# Patient Record
Sex: Female | Born: 2000 | Race: Black or African American | Hispanic: No | Marital: Single | Smoking: Never smoker
Health system: Southern US, Community
[De-identification: ages and names within clinical notes are randomized; demographics above are authoritative.]

## PROBLEM LIST (undated history)

## (undated) DIAGNOSIS — F32A Depression, unspecified: Secondary | ICD-10-CM

## (undated) DIAGNOSIS — J45909 Unspecified asthma, uncomplicated: Secondary | ICD-10-CM

## (undated) HISTORY — PX: EAR CYST EXCISION: SHX22

## (undated) HISTORY — DX: Depression, unspecified: F32.A

---
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
## 2022-06-17 ENCOUNTER — Encounter: Payer: Self-pay | Admitting: Nurse Practitioner

## 2022-06-17 ENCOUNTER — Ambulatory Visit: Payer: Medicaid Other | Admitting: Nurse Practitioner

## 2022-06-17 DIAGNOSIS — Z113 Encounter for screening for infections with a predominantly sexual mode of transmission: Secondary | ICD-10-CM

## 2022-06-17 DIAGNOSIS — J45909 Unspecified asthma, uncomplicated: Secondary | ICD-10-CM | POA: Insufficient documentation

## 2022-06-17 LAB — WET PREP FOR TRICH, YEAST, CLUE
Trichomonas Exam: NEGATIVE
Yeast Exam: NEGATIVE

## 2022-06-17 LAB — HM HIV SCREENING LAB: HM HIV Screening: NEGATIVE

## 2022-06-17 LAB — HM HEPATITIS C SCREENING LAB: HM Hepatitis Screen: NEGATIVE

## 2022-06-17 LAB — HEPATITIS B SURFACE ANTIGEN: Hepatitis B Surface Ag: NONREACTIVE

## 2022-06-17 LAB — PREGNANCY, URINE: Preg Test, Ur: NEGATIVE

## 2022-06-17 NOTE — Progress Notes (Signed)
[facility] Department  STI clinic/screening visit [address] [place] [address] [phone]  Subjective:  Laurie Patterson is a 21 y.o. female being seen today for an STI screening visit. The patient reports they do have symptoms.  Patient reports that they do not desire a pregnancy in the next year.   They reported they are not interested in discussing contraception today.  Patient currently has the Nexplanon in place.   Patient's last menstrual period was 05/16/2022 (approximate).   Patient has the following medical conditions:   Patient Active Problem List   Diagnosis Date Noted   Asthma 06/17/2022   Heavy menses 05/31/2020    Chief Complaint  Patient presents with   SEXUALLY TRANSMITTED DISEASE    Screening patient states she feels like her ph is off, she states she is having a musty odor     HPI  Patient reports to clinic today for an STD screening.  Patient report that she has been having discharge that began 3 weeks ago.    Last HIV test per patient/review of record was 09/2021. Patient reports last pap was: NA   Screening for MPX risk: Does the patient have an unexplained rash? No Is the patient MSM? No Does the patient endorse multiple sex partners or anonymous sex partners? No Did the patient have close or sexual contact with a person diagnosed with MPX? No Has the patient traveled outside the [place] where MPX is endemic? No Is there a high clinical suspicion for MPX-- evidenced by one of the following No  -Unlikely to be chickenpox  -Lymphadenopathy  -Rash that present in same phase of evolution on any given body part See flowsheet for further details and programmatic requirements.   Immunization history:   There is no immunization history on file for this patient.   The following portions of the patient's history were reviewed and updated as appropriate: allergies, current medications, past medical history, past social history,  past surgical history and problem list.  Objective:  There were no vitals filed for this visit.  Physical Exam Constitutional:      Appearance: Normal appearance.  HENT:     Head: Normocephalic. No abrasion, masses or laceration. Hair is normal.     Right Ear: External ear normal.     Left Ear: External ear normal.     Nose: Nose normal.     Mouth/Throat:     Mouth: No oral lesions.     Dentition: No dental caries.     Pharynx: No oropharyngeal exudate or posterior oropharyngeal erythema.     Tonsils: No tonsillar exudate or tonsillar abscesses.  Eyes:     General: Lids are normal.        Right eye: No discharge.        Left eye: No discharge.     Conjunctiva/sclera: Conjunctivae normal.     Right eye: No exudate.    Left eye: No exudate. Abdominal:     General: Abdomen is flat.     Palpations: Abdomen is soft.     Tenderness: There is no abdominal tenderness. There is no rebound.  Genitourinary:    Pubic Area: No rash or pubic lice.      Labia:        Right: No rash, tenderness, lesion or injury.        Left: No rash, tenderness, lesion or injury.      Vagina: Normal. No vaginal discharge, erythema or lesions.  Cervix: No cervical motion tenderness, discharge, lesion or erythema.     Uterus: Not enlarged and not tender.      Rectum: Normal.     Comments: Amount Discharge: small  Odor: Yes pH: greater than 4.5 Adheres to vaginal wall: No Color: spotting  Musculoskeletal:     Cervical back: Full passive range of motion without pain, normal range of motion and neck supple.  Lymphadenopathy:     Cervical: No cervical adenopathy.     Right cervical: No superficial, deep or posterior cervical adenopathy.    Left cervical: No superficial, deep or posterior cervical adenopathy.     Upper Body:     Right upper body: No supraclavicular, axillary or epitrochlear adenopathy.     Left upper body: No supraclavicular, axillary or epitrochlear adenopathy.     Lower Body: No  right inguinal adenopathy. No left inguinal adenopathy.  Skin:    General: Skin is warm and dry.     Findings: No lesion or rash.  Neurological:     Mental Status: She is alert and oriented to person, place, and time.  Psychiatric:        Attention and Perception: Attention normal.        Mood and Affect: Mood normal.        Speech: Speech normal.        Behavior: Behavior normal. Behavior is cooperative.      Assessment and Plan:  Laurie Patterson is a 21 y.o. female presenting to the [facility] Department for STI screening  1. Screening examination for venereal disease -21 year old female in clinic today for STD screening.  Patient desires a PT today, PT negative.   -Patient accepted all screenings including oral, vaginal CT/GC, wet prep and bloodwork for HIV/RPR.  Patient meets criteria for HepB screening? Yes. Ordered? Yes Patient meets criteria for HepC screening? Yes. Ordered? Yes  Treat wet prep per standing order Discussed time line for [facility] Lab results and that patient will be called with positive results and encouraged patient to call if she had not heard in 2 weeks.  Counseled to return or seek care for continued or worsening symptoms Recommended condom use with all sex  Patient is currently using *Nexplanon to prevent pregnancy.    - Pregnancy, urine - HIV/HCV [facility] - Syphilis Serology, [facility] - HBV Antigen/Antibody [facility] - Chlamydia/Gonorrhea [facility] - Chlamydia/Gonorrhea [facility] - WET PREP FOR TRICH, YEAST, CLUE   Total time spent: 30 minutes   Return if symptoms worsen or fail to improve.    Glenna Fellows, FNP

## 2022-06-17 NOTE — Progress Notes (Signed)
Pt here for STI screening and pregnancy test.  Wet mount results reviewed.  No treatment needed.  Pregnancy test results provided.  Condoms Eloisa Northern, RN

## 2022-08-27 ENCOUNTER — Emergency Department
Admission: EM | Admit: 2022-08-27 | Discharge: 2022-08-27 | Disposition: A | Discharge status: Home / Self Care | Payer: Medicaid Other | Attending: Emergency Medicine | Admitting: Emergency Medicine

## 2022-08-27 ENCOUNTER — Other Ambulatory Visit: Payer: Self-pay

## 2022-08-27 ENCOUNTER — Encounter: Payer: Self-pay | Admitting: Emergency Medicine

## 2022-08-27 DIAGNOSIS — W458XXA Other foreign body or object entering through skin, initial encounter: Secondary | ICD-10-CM | POA: Diagnosis not present

## 2022-08-27 DIAGNOSIS — J45909 Unspecified asthma, uncomplicated: Secondary | ICD-10-CM | POA: Diagnosis not present

## 2022-08-27 DIAGNOSIS — M795 Residual foreign body in soft tissue: Secondary | ICD-10-CM

## 2022-08-27 DIAGNOSIS — S3992XA Unspecified injury of lower back, initial encounter: Secondary | ICD-10-CM | POA: Diagnosis present

## 2022-08-27 DIAGNOSIS — S30850A Superficial foreign body of lower back and pelvis, initial encounter: Secondary | ICD-10-CM | POA: Insufficient documentation

## 2022-08-27 MED ORDER — LIDOCAINE-EPINEPHRINE-TETRACAINE (LET) TOPICAL GEL
3.0000 mL | Freq: Once | TOPICAL | Status: AC
  Administered 2022-08-27: 3 mL via TOPICAL
  Filled 2022-08-27: qty 3

## 2022-08-27 MED ORDER — BACITRACIN ZINC 500 UNIT/GM EX OINT
TOPICAL_OINTMENT | CUTANEOUS | Status: AC
  Filled 2022-08-27: qty 0.9

## 2022-08-27 MED ORDER — LIDOCAINE-EPINEPHRINE 2 %-1:100000 IJ SOLN
20.0000 mL | Freq: Once | INTRAMUSCULAR | Status: AC
Start: 2022-08-27 — End: 2022-08-27
  Administered 2022-08-27: 20 mL
  Filled 2022-08-27: qty 1

## 2022-08-27 NOTE — Discharge Instructions (Signed)
Keep wounds clean and dry.  Return to the ER for worsening symptoms, increased redness/swelling, purulent discharge or other concerns. °

## 2022-08-27 NOTE — ED Provider Notes (Signed)
[facility] Provider Note    Event Date/Time   First MD Initiated Contact with Patient 08/27/22 0234     (approximate)   History   Foreign Body   HPI  Laurie Patterson is a 21 y.o. female who presents to the ED from home with a chief complaint of requesting removal of her lower back dermal piercings.  Patient is here with her mother who is also being seen.  Had transdermal piercings placed in her lower back in May.  The right-sided one broke off months ago and only the posterior remains.  States the piercings have become a nuisance and painful so she would like them removed.     Past Medical History   Past Medical History:  Diagnosis Date   Asthma      Active Problem List   Patient Active Problem List   Diagnosis Date Noted   Asthma 06/17/2022   Heavy menses 05/31/2020     Past Surgical History  History reviewed. No pertinent surgical history.   Home Medications   Prior to Admission medications   Medication Sig Start Date End Date Taking? Authorizing Provider  etonogestrel (NEXPLANON) 68 MG IMPL implant Inject into the skin. 11/13/18   Tessa Lerner, NP  Multiple Vitamins-Minerals (MULTIVITAMIN WITH MINERALS) tablet Take 1 tablet by mouth daily. Patient not taking: Reported on 09/26/2021 05/31/20   Alberteen Spindle, CNM  naproxen (NAPROSYN) 375 MG tablet Take 375 mg by mouth 2 (two) times daily with a meal.    [provider]  norgestimate-ethinyl estradiol (SPRINTEC 28) 0.25-35 MG-MCG tablet Take 1 tablet by mouth daily. Patient not taking: Reported on 09/26/2021 05/31/20   Alberteen Spindle, CNM  ondansetron (ZOFRAN) 8 MG tablet Take 1 tablet (8 mg total) by mouth every 8 (eight) hours as needed for nausea or vomiting. Patient not taking: Reported on 09/26/2021 12/09/18   Joni Reining, PA-C  tranexamic acid (LYSTEDA) 650 MG TABS tablet Take 1,300 mg by mouth 3 (three) times daily. Patient not taking: Reported on  09/26/2021 05/25/20   [provider]     Allergies  Patient has no known allergies.   Family History   Family History  Problem Relation Age of Onset   Hypertension Mother    Cancer Maternal Grandmother    Hypertension Maternal Grandmother    Eclampsia Maternal Grandmother    Eclampsia Maternal Grandfather    Seizures Maternal Grandfather      Physical Exam  Triage Vital Signs: ED Triage Vitals  Enc Vitals Group     BP 08/27/22 0233 117/72     Pulse Rate 08/27/22 0233 98     Resp 08/27/22 0233 19     Temp 08/27/22 0233 98.6 F (37 C)     Temp Source 08/27/22 0233 Oral     SpO2 08/27/22 0233 100 %     Weight 08/27/22 0225 150 lb (68 kg)     Height 08/27/22 0225 5\' 9"  (1.753 m)     Head Circumference --      Peak Flow --      Pain Score 08/27/22 0225 0     Pain Loc --      Pain Edu? --      Excl. in GC? --     Updated Vital Signs: BP 117/72 (BP Location: Right Arm)   Pulse 98   Temp 98.6 F (37 C) (Oral)   Resp 19   Ht 5\' 9"  (1.753 m)  Wt 68 kg   LMP 08/27/2022 (Exact Date)   SpO2 100%   BMI 22.15 kg/m    General: Awake, no distress.  CV:  Good peripheral perfusion.  Resp:  Normal effort.  Abd:  No distention.  Other:  Lower back demonstrates one intact transdermal piercing on the left; right one has broken off and only the posterior remains.   ED Results / Procedures / Treatments  Labs (all labs ordered are listed, but only abnormal results are displayed) Labs Reviewed - No data to display   EKG  None   RADIOLOGY None   Official radiology report(s): No results found.   PROCEDURES:  Critical Care performed: No  ..Laceration Repair  Date/Time: 08/27/2022 4:01 AM  Performed by: Irean Hong, MD Authorized by: Irean Hong, MD   Consent:    Consent obtained:  Verbal   Consent given by:  Patient   Risks discussed:  Infection, pain, poor cosmetic result and poor wound healing Anesthesia:    Anesthesia method:  Local  infiltration   Local anesthetic:  Lidocaine 1% w/o epi Laceration details:    Location:  Trunk   Trunk location:  Lower back Pre-procedure details:    Preparation:  Patient was prepped and draped in usual sterile fashion Exploration:    Hemostasis achieved with:  Direct pressure and LET   Contaminated: no   Treatment:    Area cleansed with:  Povidone-iodine   Amount of cleaning:  Extensive   Visualized foreign bodies/material removed: no   Repair type:    Repair type:  Simple Post-procedure details:    Dressing:  Sterile dressing and antibiotic ointment   Procedure completion:  Tolerated well, no immediate complications Comments:     Both transdermal sites cleansed, numbed w/ 1% lido w/ epi and sterile prep. Stab incisions made, blunt dissection and removal of both intact transdermal as well as broken one w/ post remaining. Remaining skin tissue w/o erythema, warmth or fluctuance. Bacitracin and dressing applied by nurse.     MEDICATIONS ORDERED IN ED: Medications  lidocaine-EPINEPHrine-tetracaine (LET) topical gel (3 mLs Topical Given 08/27/22 0305)  lidocaine-EPINEPHrine (XYLOCAINE W/EPI) 2 %-1:100000 (with pres) injection 20 mL (20 mLs Infiltration Given by Other 08/27/22 0305)  bacitracin 500 UNIT/GM ointment (  Given 08/27/22 0411)     IMPRESSION / MDM / ASSESSMENT AND PLAN / ED COURSE  I reviewed the triage vital signs and the nursing notes.                             21 year old female requesting to have her transdermal piercings removed.  I had a frank discussion with the patient that I have never personally removed these types of piercings before and queried why she does not go back to the place which originally placed her piercings.  Patient states she did call but they no longer do piercing so did not remove them and has not called other places.  I did offer to numb her up, make an incision with a scalpel and bluntly dissect to remove the piercings.  Patient  acknowledges that I have never done this procedure before but would like me to try.  Patient's presentation is most consistent with acute, uncomplicated illness.  0354 Patient tolerated removal well. Both transdermal piercings removed and given to patient. Area cleansed, bacitracin applied with gauze dressing. Strict return precautions given. Patient verbalizes understanding agrees with plan of care.  FINAL CLINICAL IMPRESSION(S) /  ED DIAGNOSES   Final diagnoses:  Foreign body (FB) in soft tissue     Rx / DC Orders   ED Discharge Orders     None        Note:  This document was prepared using Dragon voice recognition software and may include unintentional dictation errors.   Paulette Blanch, MD 08/27/22 [phone]

## 2022-08-27 NOTE — ED Triage Notes (Addendum)
Patient ambulatory to triage with steady gait, without difficulty or distress noted; pt reports she would like her lower back dermal piercings removed; placed in May; no redness or swelling noted to site

## 2022-11-13 ENCOUNTER — Encounter: Payer: Self-pay | Admitting: Family Medicine

## 2022-11-13 ENCOUNTER — Ambulatory Visit (LOCAL_COMMUNITY_HEALTH_CENTER): Payer: Medicaid Other | Admitting: Family Medicine

## 2022-11-13 ENCOUNTER — Ambulatory Visit: Payer: Medicaid Other | Admitting: Family Medicine

## 2022-11-13 VITALS — BP 114/77 | Ht 69.0 in | Wt 163.0 lb

## 2022-11-13 DIAGNOSIS — Z113 Encounter for screening for infections with a predominantly sexual mode of transmission: Secondary | ICD-10-CM

## 2022-11-13 DIAGNOSIS — Z62810 Personal history of physical and sexual abuse in childhood: Secondary | ICD-10-CM | POA: Insufficient documentation

## 2022-11-13 DIAGNOSIS — Z309 Encounter for contraceptive management, unspecified: Secondary | ICD-10-CM

## 2022-11-13 DIAGNOSIS — Z3046 Encounter for surveillance of implantable subdermal contraceptive: Secondary | ICD-10-CM | POA: Diagnosis not present

## 2022-11-13 DIAGNOSIS — Z0000 Encounter for general adult medical examination without abnormal findings: Secondary | ICD-10-CM

## 2022-11-13 LAB — WET PREP FOR TRICH, YEAST, CLUE
Trichomonas Exam: NEGATIVE
Yeast Exam: NEGATIVE

## 2022-11-13 LAB — HM HIV SCREENING LAB: HM HIV Screening: NEGATIVE

## 2022-11-13 MED ORDER — ETONOGESTREL 68 MG ~~LOC~~ IMPL
68.0000 mg | DRUG_IMPLANT | Freq: Once | SUBCUTANEOUS | Status: AC
  Administered 2022-11-13: 68 mg via SUBCUTANEOUS

## 2022-11-13 NOTE — Progress Notes (Signed)
Pt is here for PE, Nexplanon removal and reinsertion.  She also wants STD screening. Provider completed procedures without any difficulties.  Pt tolerated well.  FP packet given.  Windle Guard, RN

## 2022-11-13 NOTE — Progress Notes (Signed)
[facility] Department  STI clinic/screening visit [place] [address] [phone]  Subjective:  Laurie Patterson is a 22 y.o. female being seen today for an STI screening visit. The patient reports they do not have symptoms.  Patient reports that they do not desire a pregnancy in the next year.   They reported they are not interested in discussing contraception today.    Patient's last menstrual period was 11/06/2022.   Patient has the following medical conditions:   Patient Active Problem List   Diagnosis Date Noted   Asthma 06/17/2022   Heavy menses 05/31/2020    Chief Complaint  Patient presents with   SEXUALLY TRANSMITTED DISEASE    Screening- patient is not having any symptoms     HPI  Patient reports to clinic for STI Testing.  Does the patient using douching products? No  Last HIV test per patient/review of record was  Lab Results  Component Value Date   HMHIVSCREEN Negative - Validated 06/17/2022   No results found for: "HIV" Patient reports last pap was never patient is not 23.   Screening for MPX risk: Does the patient have an unexplained rash? No Is the patient MSM? No Does the patient endorse multiple sex partners or anonymous sex partners? No Did the patient have close or sexual contact with a person diagnosed with MPX? No Has the patient traveled outside the [place] where MPX is endemic? No Is there a high clinical suspicion for MPX-- evidenced by one of the following No  -Unlikely to be chickenpox  -Lymphadenopathy  -Rash that present in same phase of evolution on any given body part See flowsheet for further details and programmatic requirements.   Immunization history:   There is no immunization history on file for this patient.   The following portions of the patient's history were reviewed and updated as appropriate: allergies, current medications, past medical history, past social history, past surgical  history and problem list.  Objective:  There were no vitals filed for this visit.  Physical Exam Vitals and nursing note reviewed.  Constitutional:      Appearance: Normal appearance.  HENT:     Head: Normocephalic and atraumatic.     Mouth/Throat:     Mouth: Mucous membranes are moist.     Pharynx: Oropharynx is clear. No oropharyngeal exudate or posterior oropharyngeal erythema.  Pulmonary:     Effort: Pulmonary effort is normal.  Abdominal:     General: Abdomen is flat.     Palpations: There is no mass.     Tenderness: There is no abdominal tenderness. There is no rebound.  Genitourinary:    Comments: Declined genital exam- self swabbed Lymphadenopathy:     Head:     Right side of head: No preauricular or posterior auricular adenopathy.     Left side of head: No preauricular or posterior auricular adenopathy.     Cervical: No cervical adenopathy.     Upper Body:     Right upper body: No supraclavicular, axillary or epitrochlear adenopathy.     Left upper body: No supraclavicular, axillary or epitrochlear adenopathy.  Skin:    General: Skin is warm and dry.     Findings: No rash.  Neurological:     Mental Status: She is alert and oriented to person, place, and time.     Assessment and Plan:  Laurie Patterson is a 22 y.o. female presenting to the [facility] Department for STI screening  1. Screening for venereal disease  - HIV [facility] [facility] - Syphilis Serology, [facility] - [facility]   Patient accepted all screenings including vaginal CT/GC and bloodwork for HIV/RPR, and wet prep. Patient meets criteria for HepB screening? No. Ordered? No - not indicated Patient meets criteria for HepC screening? No. Ordered? No - not indicated  Treat wet prep per standing order Discussed time line for State Lab results and that patient will be called with positive results and encouraged patient to call if she had not heard in 2 weeks.   Counseled to return or seek care for continued or worsening symptoms Recommended condom use with all sex  Patient is currently using *Nexplanon to prevent pregnancy.    No follow-ups on file.  Future Appointments  Date Time Provider [place]  11/13/2022  8:30 AM Sharlet Salina, FNP [facility] None  11/14/2022 11:00 AM Jonetta Osgood, NP [place] None    Sharlet Salina, [place]
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
## 2022-11-14 ENCOUNTER — Ambulatory Visit: Payer: Self-pay | Admitting: Nurse Practitioner

## 2022-12-03 ENCOUNTER — Other Ambulatory Visit: Payer: Self-pay

## 2022-12-03 ENCOUNTER — Emergency Department
Admission: EM | Admit: 2022-12-03 | Discharge: 2022-12-03 | Disposition: A | Discharge status: Home / Self Care | Payer: Medicaid Other | Attending: Emergency Medicine | Admitting: Emergency Medicine

## 2022-12-03 DIAGNOSIS — R519 Headache, unspecified: Secondary | ICD-10-CM | POA: Diagnosis not present

## 2022-12-03 MED ORDER — DROPERIDOL 2.5 MG/ML IJ SOLN
1.2500 mg | Freq: Once | INTRAMUSCULAR | Status: AC
  Administered 2022-12-03: 1.25 mg via INTRAMUSCULAR
  Filled 2022-12-03: qty 2

## 2022-12-03 MED ORDER — BUTALBITAL-APAP-CAFFEINE 50-325-40 MG PO TABS
2.0000 | ORAL_TABLET | Freq: Once | ORAL | Status: AC
  Administered 2022-12-03: 2 via ORAL
  Filled 2022-12-03: qty 2

## 2022-12-03 MED ORDER — KETOROLAC TROMETHAMINE 30 MG/ML IJ SOLN
30.0000 mg | Freq: Once | INTRAMUSCULAR | Status: AC
Start: 2022-12-03 — End: 2022-12-03
  Administered 2022-12-03: 30 mg via INTRAMUSCULAR
  Filled 2022-12-03: qty 1

## 2022-12-03 NOTE — Discharge Instructions (Signed)
Use Tylenol for pain and fevers.  Up to 1000 mg per dose, up to 4 times per day.  Do not take more than 4000 mg of Tylenol/acetaminophen within 24 hours..  Use naproxen/Aleve for anti-inflammatory pain relief. Use up to 500mg every 12 hours. Do not take more frequently than this. Do not use other NSAIDs (ibuprofen, Advil) while taking this medication. It is safe to take Tylenol with this.   
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
## 2022-12-11 ENCOUNTER — Telehealth: Payer: BLUE CROSS/BLUE SHIELD | Admitting: Physician Assistant

## 2022-12-11 DIAGNOSIS — L910 Hypertrophic scar: Secondary | ICD-10-CM

## 2022-12-11 NOTE — Progress Notes (Signed)
Because this is a chronic issue that needs further evaluation and management than we can provide via e-visit, I feel your condition warrants further evaluation and I recommend that you be seen in a face to face visit.   NOTE: There will be NO CHARGE for this eVisit   If you are having a true medical emergency please call 911.      For an urgent face to face visit, [place] has eight urgent care centers for your convenience:   NEW!! [place] [facility] [place] [facility] [place] Get Driving Directions [phone] [address] [place], [place] [facility] [place] at [place] Get Driving Directions [phone] [address] [place], [place] [address]   [place] [facility] [place]) Get Driving Directions [phone] [address], [place] [address]  [place] [facility] [place] ([place]) Get Driving Directions [phone] [address] [place],  [place]  [address]  [place] [facility] - at [address] Get Driving Directions  [phone] [address] [place],  [place] [address]   [place] [facility] Get Driving Directions [phone] [address] [place], [address] [place], [place] [address]   [place] [facility] Get Driving Directions  [phone] [address] [place], [place] [address]   [place] [facility] [place] Get Driving Directions [phone] [address], [place], [place] [address]  Your MyChart E-visit questionnaire answers were reviewed by a board certified advanced clinical practitioner to complete your personal care plan based on your specific symptoms.  Thank you for using e-Visits.

## 2023-01-03 ENCOUNTER — Ambulatory Visit: Payer: Medicaid Other

## 2023-01-15 IMAGING — CT CT HEAD W/O CM
4 series · 17 of 47 positions shown, 19 images · non-contrast
Comparison: None.

CLINICAL DATA: Chronic headaches



[Series 2: head wo · axial · 0.43mm/px · z∈[-84,+26]mm · 7 of 30 slices shown, 9 images]
[im 4/30  brain]
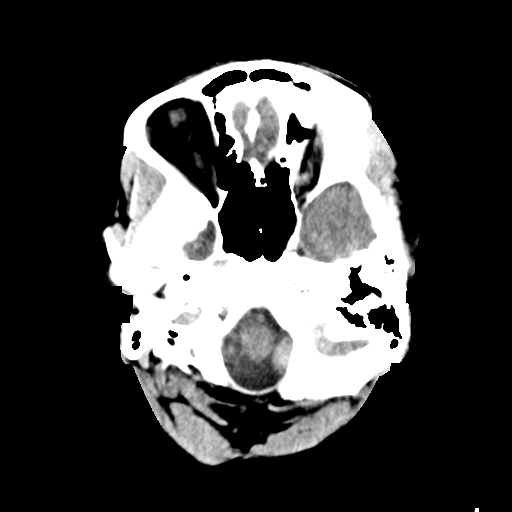
[im 4/30  bone]
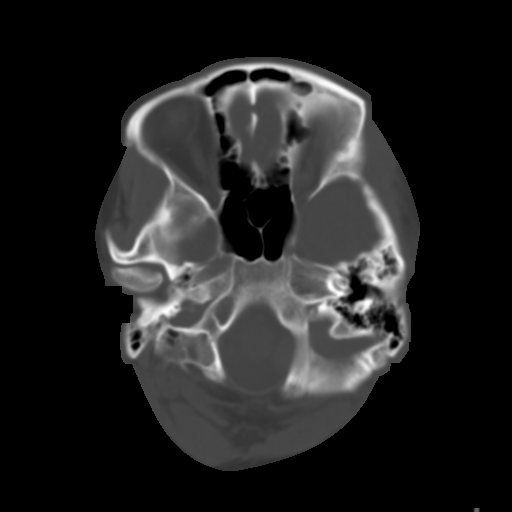
[im 8/30  brain]
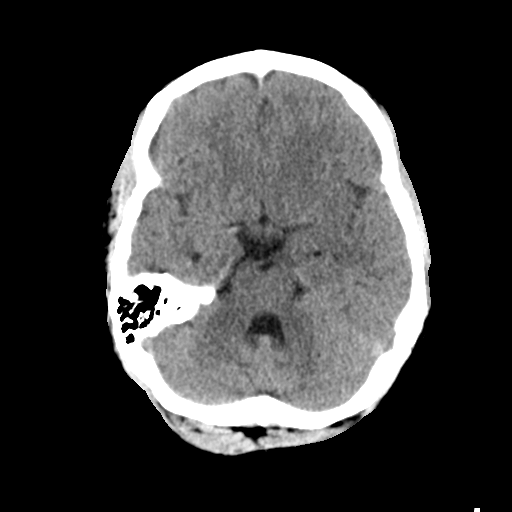
[im 11/30  brain]
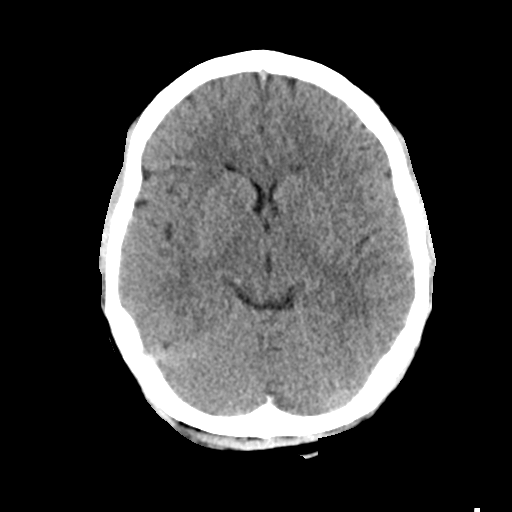
[im 15/30  brain]
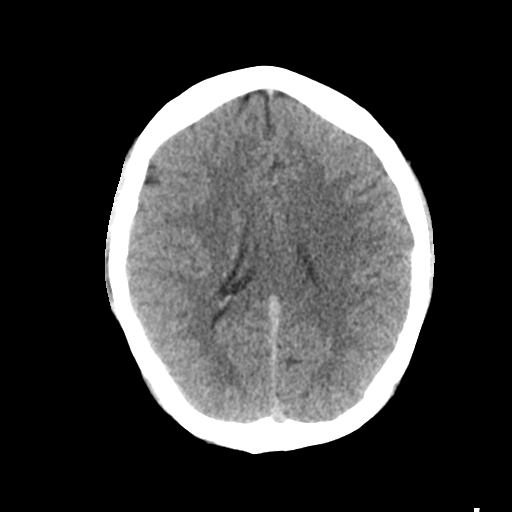
[im 19/30  brain]
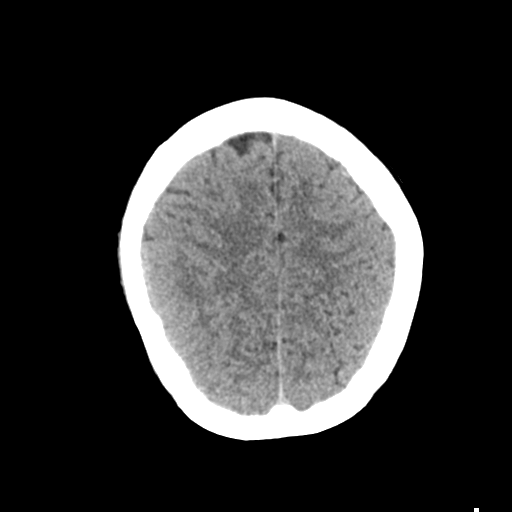
[im 19/30  bone]
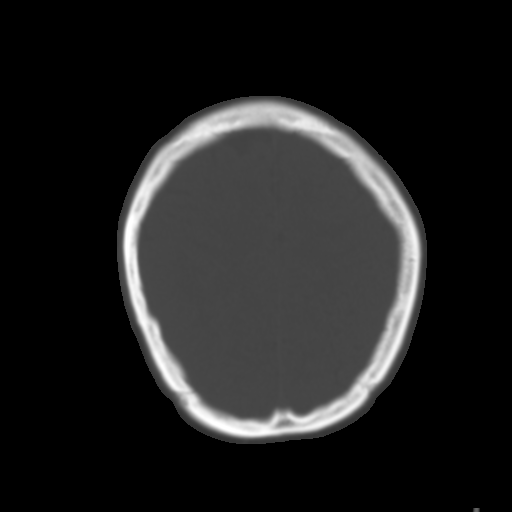
[im 22/30  brain]
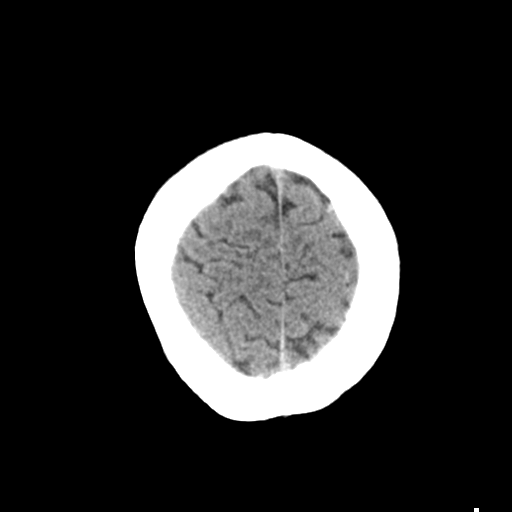
[im 26/30  brain]
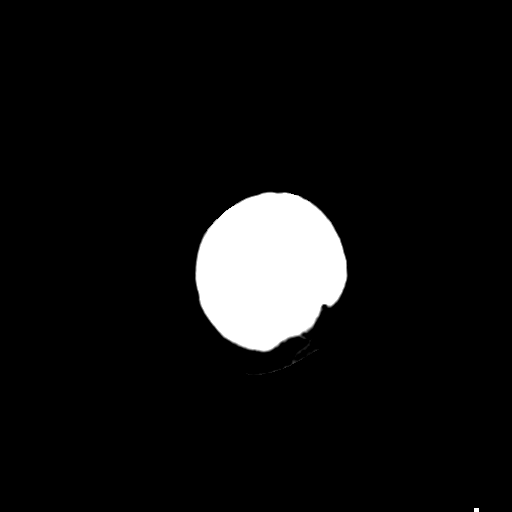

[Series 3: head bone · axial · 0.43mm/px · z∈[-85,-35]mm · 4 of 74 slices shown]
[im 8/74  bone]
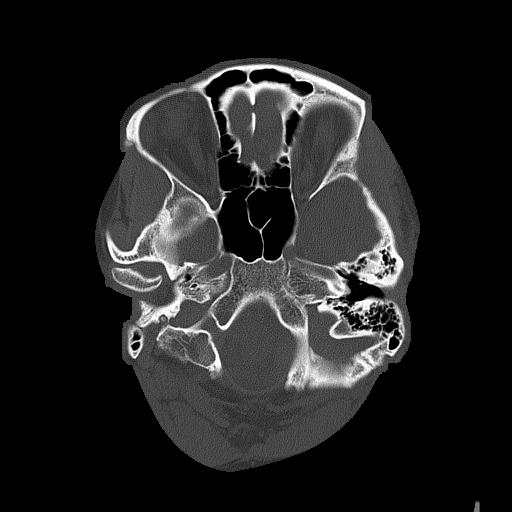
[im 15/74  bone]
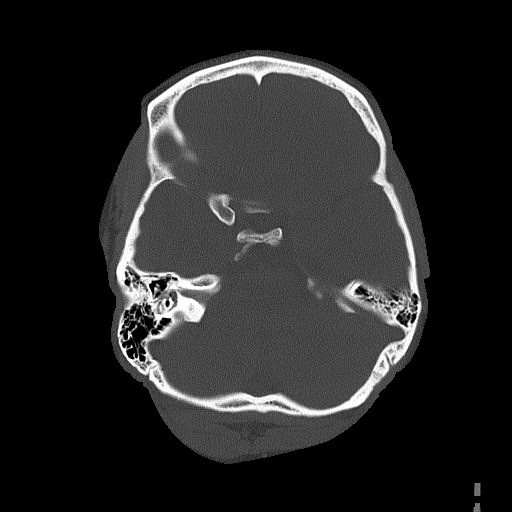
[im 22/74  bone]
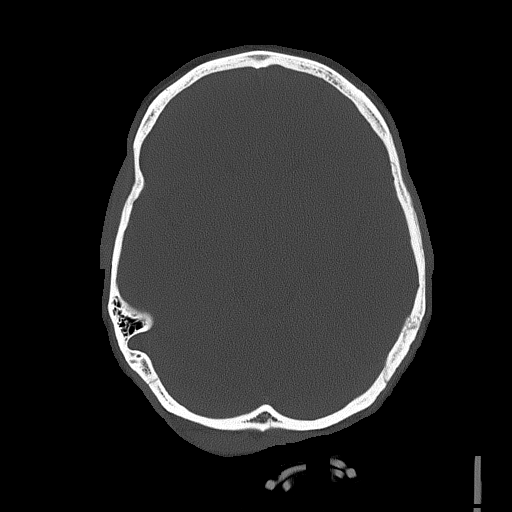
[im 33/74  bone]
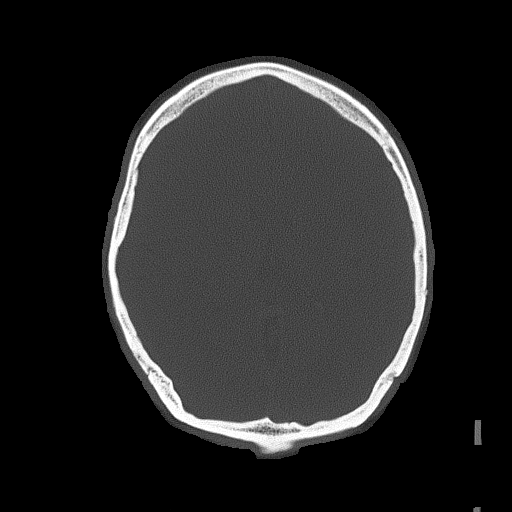

[Series 4: coronal soft tissue · coronal · 0.32mm/px · 3 of 62 slices shown]
[im 21/62  brain]
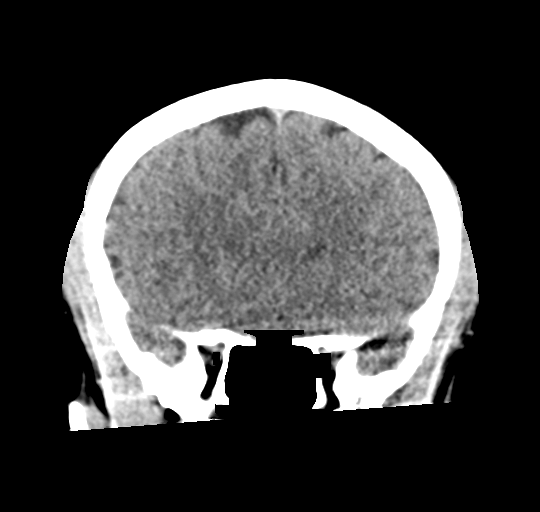
[im 28/62  brain]
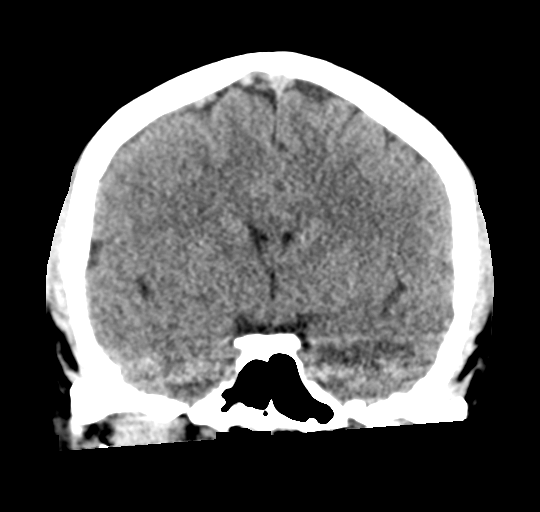
[im 34/62  brain]
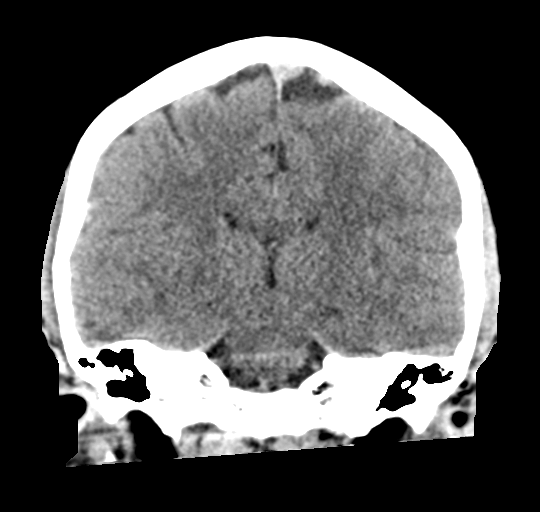

[Series 5: sagittal soft tissue · sagittal · 0.32mm/px · 3 of 54 slices shown]
[im 18/54  brain]
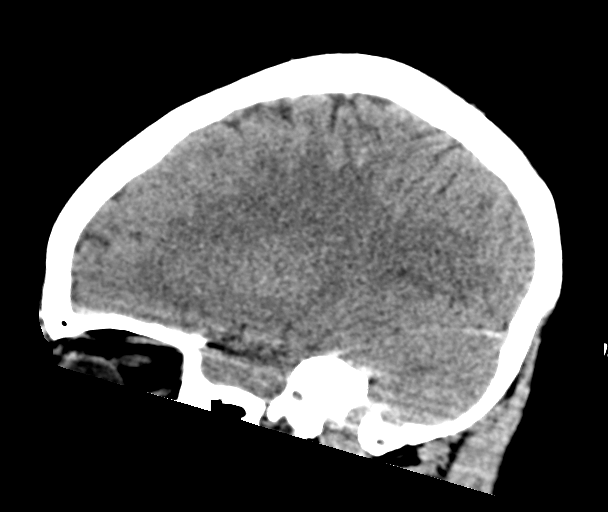
[im 27/54  brain]
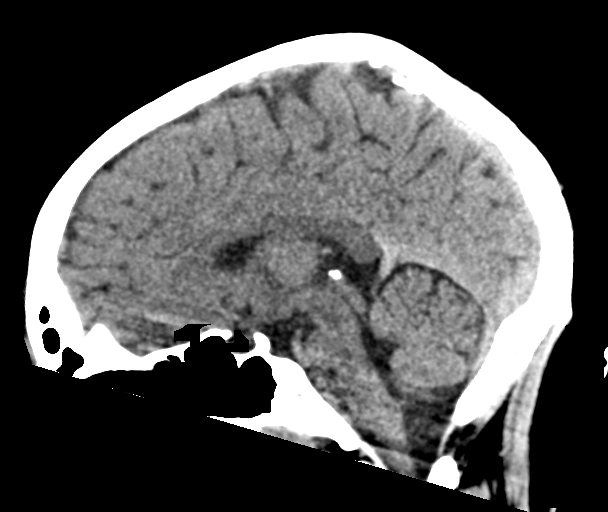
[im 36/54  brain]
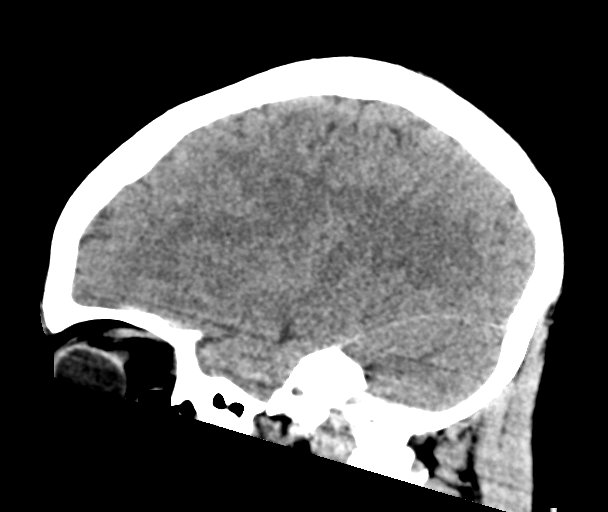

[17 of 47 positions shown; findings below may reference images not displayed]

FINDINGS: Brain: No evidence of acute infarction, hemorrhage, hydrocephalus,
extra-axial collection or mass lesion/mass effect.

Vascular: No hyperdense vessel or unexpected calcification.

Skull: Normal. Negative for fracture or focal lesion.

Sinuses/Orbits: No acute finding.

Other: None.
IMPRESSION: No acute intracranial abnormality noted.

## 2023-01-16 ENCOUNTER — Ambulatory Visit (LOCAL_COMMUNITY_HEALTH_CENTER): Payer: Medicaid Other | Admitting: Advanced Practice Midwife

## 2023-01-16 VITALS — BP 110/68 | Ht 69.0 in | Wt 162.4 lb

## 2023-01-16 DIAGNOSIS — Z3046 Encounter for surveillance of implantable subdermal contraceptive: Secondary | ICD-10-CM | POA: Diagnosis not present

## 2023-01-16 DIAGNOSIS — Z308 Encounter for other contraceptive management: Secondary | ICD-10-CM

## 2023-01-16 DIAGNOSIS — Z3009 Encounter for other general counseling and advice on contraception: Secondary | ICD-10-CM

## 2023-01-16 NOTE — Progress Notes (Signed)
Pt is here for Nexplanon removal.  Pt declined additional BCM.  Windle Guard, RN
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
## 2023-03-03 ENCOUNTER — Ambulatory Visit (LOCAL_COMMUNITY_HEALTH_CENTER): Payer: Medicaid Other

## 2023-03-03 VITALS — BP 103/70 | Ht 69.0 in | Wt 168.5 lb

## 2023-03-03 DIAGNOSIS — Z3202 Encounter for pregnancy test, result negative: Secondary | ICD-10-CM | POA: Diagnosis not present

## 2023-03-03 LAB — PREGNANCY, URINE: Preg Test, Ur: NEGATIVE

## 2023-03-03 NOTE — Progress Notes (Signed)
UPT negative. Results reviewed. Negative preg packet given and contents reviewed. Last sex unprotected 02/15/23. LMP 02/09/23. Latex free condoms given per pt request. Not interested in birth control today. Local provider resource list given and encouraged to establish PCP. Jerel Shepherd, RN

## 2023-06-05 ENCOUNTER — Other Ambulatory Visit: Payer: Self-pay

## 2023-06-05 ENCOUNTER — Encounter: Payer: Self-pay | Admitting: Emergency Medicine

## 2023-06-05 DIAGNOSIS — M542 Cervicalgia: Secondary | ICD-10-CM | POA: Diagnosis not present

## 2023-06-05 DIAGNOSIS — Z5321 Procedure and treatment not carried out due to patient leaving prior to being seen by health care provider: Secondary | ICD-10-CM | POA: Insufficient documentation

## 2023-06-05 DIAGNOSIS — M546 Pain in thoracic spine: Secondary | ICD-10-CM | POA: Insufficient documentation

## 2023-06-05 DIAGNOSIS — Y92410 Unspecified street and highway as the place of occurrence of the external cause: Secondary | ICD-10-CM | POA: Insufficient documentation

## 2023-06-05 DIAGNOSIS — R519 Headache, unspecified: Secondary | ICD-10-CM | POA: Diagnosis present
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
## 2023-06-06 ENCOUNTER — Emergency Department
Admission: EM | Admit: 2023-06-06 | Discharge: 2023-06-06 | Discharge status: Against Medical Advice | Payer: Medicaid Other | Attending: Emergency Medicine | Admitting: Emergency Medicine

## 2023-06-06 NOTE — ED Notes (Signed)
Pt has not returned

## 2023-06-06 NOTE — ED Notes (Signed)
Pt noted leaving ED lobby 

## 2023-06-06 NOTE — ED Notes (Signed)
No answer when called several times from lobby 

## 2023-06-23 ENCOUNTER — Other Ambulatory Visit: Payer: Self-pay

## 2023-06-23 ENCOUNTER — Emergency Department
Admission: EM | Admit: 2023-06-23 | Discharge: 2023-06-23 | Disposition: A | Discharge status: Home / Self Care | Payer: Medicaid Other | Attending: Emergency Medicine | Admitting: Emergency Medicine

## 2023-06-23 DIAGNOSIS — R21 Rash and other nonspecific skin eruption: Secondary | ICD-10-CM | POA: Diagnosis present

## 2023-06-23 DIAGNOSIS — X58XXXA Exposure to other specified factors, initial encounter: Secondary | ICD-10-CM | POA: Insufficient documentation

## 2023-06-23 DIAGNOSIS — R233 Spontaneous ecchymoses: Secondary | ICD-10-CM

## 2023-06-23 DIAGNOSIS — S7012XA Contusion of left thigh, initial encounter: Secondary | ICD-10-CM | POA: Diagnosis not present

## 2023-06-23 LAB — CBC WITH DIFFERENTIAL/PLATELET
Abs Immature Granulocytes: 0.01 10*3/uL (ref 0.00–0.07)
Basophils Absolute: 0 10*3/uL (ref 0.0–0.1)
Basophils Relative: 0 %
Eosinophils Absolute: 0.1 10*3/uL (ref 0.0–0.5)
Eosinophils Relative: 1 %
HCT: 41.9 % (ref 36.0–46.0)
Hemoglobin: 13.9 g/dL (ref 12.0–15.0)
Immature Granulocytes: 0 %
Lymphocytes Relative: 37 %
Lymphs Abs: 2.6 10*3/uL (ref 0.7–4.0)
MCH: 30.6 pg (ref 26.0–34.0)
MCHC: 33.2 g/dL (ref 30.0–36.0)
MCV: 92.3 fL (ref 80.0–100.0)
Monocytes Absolute: 0.4 10*3/uL (ref 0.1–1.0)
Monocytes Relative: 6 %
Neutro Abs: 3.9 10*3/uL (ref 1.7–7.7)
Neutrophils Relative %: 56 %
Platelets: 212 10*3/uL (ref 150–400)
RBC: 4.54 MIL/uL (ref 3.87–5.11)
RDW: 13.6 % (ref 11.5–15.5)
WBC: 7 10*3/uL (ref 4.0–10.5)
nRBC: 0 % (ref 0.0–0.2)

## 2023-06-23 LAB — COMPREHENSIVE METABOLIC PANEL
ALT: 12 U/L (ref 0–44)
AST: 17 U/L (ref 15–41)
Albumin: 4.5 g/dL (ref 3.5–5.0)
Alkaline Phosphatase: 60 U/L (ref 38–126)
Anion gap: 10 (ref 5–15)
BUN: 11 mg/dL (ref 6–20)
CO2: 26 mmol/L (ref 22–32)
Calcium: 9.5 mg/dL (ref 8.9–10.3)
Chloride: 105 mmol/L (ref 98–111)
Creatinine, Ser: 0.65 mg/dL (ref 0.44–1.00)
GFR, Estimated: >60 mL/min (ref >60)
Glucose, Bld: 102 mg/dL — ABNORMAL HIGH (ref 70–99)
Potassium: 3.4 mmol/L — ABNORMAL LOW (ref 3.5–5.1)
Sodium: 141 mmol/L (ref 135–145)
Total Bilirubin: 0.9 mg/dL (ref 0.3–1.2)
Total Protein: 7.6 g/dL (ref 6.5–8.1)

## 2023-06-23 NOTE — Discharge Instructions (Signed)
Your blood test today were normal.  I made a referral to a primary doctor and blood specialist for further testing for your easy bruising.  Thank you for choosing [place] for your health care today!  Please see your primary doctor this week for a follow up appointment.   If you have any new, worsening, or unexpected symptoms call your doctor right away or come back to the emergency department for reevaluation.  It was my pleasure to care for you today.   Daneil Dan [place] Charon, MD

## 2023-06-23 NOTE — ED Provider Notes (Signed)
[facility] Provider Note    Event Date/Time   First MD Initiated Contact with Patient 06/23/23 [phone]     (approximate)   History   Rash (Bruising to legs)   HPI  Laurie Patterson is a 22 y.o. female   Past medical history of no significant past medical history presents emergency department with easy bruising to bilateral lower extremities.  She noticed a bruise to her medial left thigh with no significant trauma.  She notes history of years along easy bruising.  She is never been worked up for this.  No significant family history of clotting disorders or bleeding disorders.  She does have a heavy menses.  She reports no other acute medical complaints.  External Medical Documents Reviewed: [facility] clinical summary denying family history of abnormal uterine bleeding in her mother      Physical Exam   Triage Vital Signs: ED Triage Vitals  Encounter Vitals Group     BP 06/23/23 0026 114/75     Systolic BP Percentile --      Diastolic BP Percentile --      Pulse Rate 06/23/23 0026 90     Resp 06/23/23 0026 19     Temp 06/23/23 0026 98.2 F (36.8 C)     Temp src --      SpO2 06/23/23 0026 100 %     Weight 06/23/23 0027 163 lb (73.9 kg)     Height 06/23/23 0027 5\' 7"  (1.702 m)     Head Circumference --      Peak Flow --      Pain Score 06/23/23 0027 0     Pain Loc --      Pain Education --      Exclude from Growth Chart --     Most recent vital signs: Vitals:   06/23/23 0026 06/23/23 0145  BP: 114/75 115/78  Pulse: 90 84  Resp: 19 18  Temp: 98.2 F (36.8 C) 98.2 F (36.8 C)  SpO2: 100% 99%    General: Awake, no distress.  CV:  Good peripheral perfusion.  Resp:  Normal effort.  Abd:  No distention.  Other:  Awake alert comfortable appearing no acute distress with normal vital signs.  There is a contusion to the left medial thigh, no significant tenderness to palpation, neurovascular intact.   ED Results / Procedures /  Treatments   Labs (all labs ordered are listed, but only abnormal results are displayed) Labs Reviewed  COMPREHENSIVE METABOLIC PANEL - Abnormal; Notable for the following components:      Result Value   Potassium 3.4 (*)    Glucose, Bld 102 (*)    All other components within normal limits  CBC WITH DIFFERENTIAL/PLATELET     I ordered and reviewed the above labs they are notable for platelets and H&H normal.     PROCEDURES:  Critical Care performed: No  Procedures   MEDICATIONS ORDERED IN ED: Medications - No data to display  IMPRESSION / MDM / ASSESSMENT AND PLAN / ED COURSE  I reviewed the triage vital signs and the nursing notes.                                Patient's presentation is most consistent with acute presentation with potential threat to life or bodily function.  Differential diagnosis includes, but is not limited to, bleeding disorder, contusion, DVT, von Willebrand's, platelet dysfunction,  thrombocytopenia   MDM:   Basic labs including cell counts electrolytes and platelet quantitative normal.  Could be qualitative dysfunction like von Willebrand's, other bleeding disorders, in this well-appearing patient with chronic easy bruising, doubt life-threatening bleed at this time and made referral to PMD and hematology for further workup.       FINAL CLINICAL IMPRESSION(S) / ED DIAGNOSES   Final diagnoses:  Easy bruising     Rx / DC Orders   ED Discharge Orders          Ordered    Ambulatory Referral to Primary Care (Establish Care)        06/23/23 0135    Ambulatory referral to Hematology / Oncology       Comments: Your emergency department provider has referred you to see a hematology/oncology specialist. These are physicians who specialize in blood disorders and cancers, or findings concerning for cancer. You will receive a phone call from the [facility] to set up your appointment within 2 business days: [employer] operate  Mon - Fri, 8:00 a.m. to 5:00 p.m.; closed for federally recognized holidays. Please be sure your phone is not set to block numbers during this time.   06/23/23 0135             Note:  This document was prepared using Dragon voice recognition software and may include unintentional dictation errors.    Pilar Jarvis, MD 06/23/23 [phone]

## 2023-06-23 NOTE — ED Notes (Signed)
Basics and blue top sent on pt.
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
## 2023-06-25 ENCOUNTER — Other Ambulatory Visit: Payer: Self-pay

## 2023-06-25 ENCOUNTER — Encounter: Payer: Self-pay | Admitting: Oncology

## 2023-06-25 ENCOUNTER — Inpatient Hospital Stay: Payer: Medicaid Other

## 2023-06-25 ENCOUNTER — Other Ambulatory Visit: Payer: Self-pay | Admitting: Oncology

## 2023-06-25 ENCOUNTER — Inpatient Hospital Stay: Payer: Medicaid Other | Attending: Oncology | Admitting: Oncology

## 2023-06-25 VITALS — BP 111/72 | HR 72 | Temp 96.2°F | Resp 18 | Wt 164.8 lb

## 2023-06-25 DIAGNOSIS — R233 Spontaneous ecchymoses: Secondary | ICD-10-CM | POA: Diagnosis present

## 2023-06-25 DIAGNOSIS — R5383 Other fatigue: Secondary | ICD-10-CM | POA: Diagnosis not present

## 2023-06-25 LAB — PLATELET FUNCTION ASSAY: Collagen / Epinephrine: 96 seconds (ref 0–193)

## 2023-06-25 LAB — PROTIME-INR
INR: 1 (ref 0.8–1.2)
Prothrombin Time: 13.8 seconds (ref 11.4–15.2)

## 2023-06-25 LAB — CBC WITH DIFFERENTIAL/PLATELET
Abs Immature Granulocytes: 0.01 10*3/uL (ref 0.00–0.07)
Basophils Absolute: 0 10*3/uL (ref 0.0–0.1)
Basophils Relative: 1 %
Eosinophils Absolute: 0.1 10*3/uL (ref 0.0–0.5)
Eosinophils Relative: 1 %
HCT: 44.1 % (ref 36.0–46.0)
Hemoglobin: 14.5 g/dL (ref 12.0–15.0)
Immature Granulocytes: 0 %
Lymphocytes Relative: 51 %
Lymphs Abs: 2.2 10*3/uL (ref 0.7–4.0)
MCH: 30.2 pg (ref 26.0–34.0)
MCHC: 32.9 g/dL (ref 30.0–36.0)
MCV: 91.9 fL (ref 80.0–100.0)
Monocytes Absolute: 0.2 10*3/uL (ref 0.1–1.0)
Monocytes Relative: 5 %
Neutro Abs: 1.9 10*3/uL (ref 1.7–7.7)
Neutrophils Relative %: 42 %
Platelets: 220 10*3/uL (ref 150–400)
RBC: 4.8 MIL/uL (ref 3.87–5.11)
RDW: 13.5 % (ref 11.5–15.5)
WBC: 4.4 10*3/uL (ref 4.0–10.5)
nRBC: 0 % (ref 0.0–0.2)

## 2023-06-25 LAB — HCG, QUANTITATIVE, PREGNANCY: hCG, Beta Chain, Quant, S: <1 m[IU]/mL (ref <5)

## 2023-06-25 LAB — HEPATITIS PANEL, ACUTE
HCV Ab: NONREACTIVE
Hep A IgM: NONREACTIVE
Hep B C IgM: NONREACTIVE
Hepatitis B Surface Ag: NONREACTIVE

## 2023-06-25 LAB — HIV ANTIBODY (ROUTINE TESTING W REFLEX): HIV Screen 4th Generation wRfx: NONREACTIVE

## 2023-06-25 LAB — TSH: TSH: 1.446 u[IU]/mL (ref 0.350–4.500)

## 2023-06-25 LAB — APTT: aPTT: 26 seconds (ref 24–36)

## 2023-06-25 NOTE — Assessment & Plan Note (Signed)
Check TSH 

## 2023-06-25 NOTE — Assessment & Plan Note (Signed)
Recommend cbc, PT, PTT, platelet function assay, Von Willebrand panel, HIV, Hepatitis, pregnancy test.

## 2023-06-25 NOTE — Progress Notes (Signed)
Hematology/Oncology Consult note [phone] [phone]        REFERRING PROVIDER: Pilar Jarvis, MD   CHIEF COMPLAINTS/REASON FOR VISIT:  Evaluation of easy bruising.    ASSESSMENT & PLAN:   Easy bruising Recommend cbc, PT, PTT, platelet function assay, Von Willebrand panel, HIV, Hepatitis, pregnancy test.  Fatigue Check TSH   Orders Placed This Encounter  Procedures   CBC with Differential/Platelet    Standing Status:   Future    Number of Occurrences:   1    Standing Expiration Date:   06/24/2024   TSH    Standing Status:   Future    Number of Occurrences:   1    Standing Expiration Date:   06/24/2024   Protime-INR    Standing Status:   Future    Number of Occurrences:   1    Standing Expiration Date:   06/24/2024   APTT    Standing Status:   Future    Number of Occurrences:   1    Standing Expiration Date:   06/24/2024   Platelet function assay    Standing Status:   Future    Number of Occurrences:   1    Standing Expiration Date:   06/24/2024   Von Willebrand panel    Standing Status:   Future    Number of Occurrences:   1    Standing Expiration Date:   06/24/2024   HIV Antibody (routine testing w rflx)    Standing Status:   Future    Number of Occurrences:   1    Standing Expiration Date:   06/24/2024   Hepatitis panel, acute    Standing Status:   Future    Number of Occurrences:   1    Standing Expiration Date:   06/24/2024   Pregnancy, urine    Standing Status:   Future    Standing Expiration Date:   06/24/2024   hCG, quantitative, pregnancy   Follow up in 3-4 weeks to review results.   All questions were answered. The patient knows to call the clinic with any problems, questions or concerns.  Rickard Patience, MD, PhD [facility] Hematology Oncology 06/25/2023   HISTORY OF PRESENTING ILLNESS:   Laurie Patterson is a  22 y.o.  female with PMH listed below was seen in consultation at the request of  Pilar Jarvis, MD  for evaluation of  easy bruising.  She noticed lower extremity bruising last Friday and went to ER for evaluation.  Cbc showed normal platelet count.  She denies any recent trauma to lower extremities.  She denies history of epistaxis, gum bleeding, hematochezia, hematuria, hematemesis,  black tarry stool or easy bruising. She denies recent NSAIDS use.  LMP was 05/07/23, she missed a period. Sexually active, she uses condom sometimes. Not on any other contraceptive measure.    MEDICAL HISTORY:  Past Medical History:  Diagnosis Date   Asthma    Depression     SURGICAL HISTORY: Past Surgical History:  Procedure Laterality Date   EAR CYST EXCISION      SOCIAL HISTORY: Social History   Socioeconomic History   Marital status: Single    Spouse name: Not on file   Number of children: 0   Years of education: 10   Highest education level: 10th grade  Occupational History   Occupation: Consulting civil engineer  Tobacco Use   Smoking status: Former    Types: E-cigarettes    Quit date: 01/30/2020  Years since quitting: 3.4   Smokeless tobacco: Never  Vaping Use   Vaping status: Former   Substances: Nicotine, Flavoring  Substance and Sexual Activity   Alcohol use: No   Drug use: Not Currently    Types: Marijuana   Sexual activity: Yes    Partners: Male    Birth control/protection: Condom, None  Other Topics Concern   Not on file  Social History Narrative   Not on file   Social Determinants of Health   Financial Resource Strain: Low Risk  (06/25/2023)   Overall Financial Resource Strain (CARDIA)    Difficulty of Paying Living Expenses: Not very hard  Food Insecurity: No Food Insecurity (06/25/2023)   Hunger Vital Sign    Worried About Running Out of Food in the Last Year: Never true    Ran Out of Food in the Last Year: Never true  Transportation Needs: No Transportation Needs (06/25/2023)   PRAPARE - Administrator, Civil Service (Medical): No    Lack of Transportation (Non-Medical): No   Physical Activity: Not on file  Stress: Not on file  Social Connections: Not on file  Intimate Partner Violence: Not At Risk (03/03/2023)   Humiliation, Afraid, Rape, and Kick questionnaire    Fear of Current or Ex-Partner: No    Emotionally Abused: No    Physically Abused: No    Sexually Abused: No    FAMILY HISTORY: Family History  Problem Relation Age of Onset   Hypertension Mother    Cancer Maternal Grandmother    Hypertension Maternal Grandmother    Eclampsia Maternal Grandmother    Eclampsia Maternal Grandfather    Seizures Maternal Grandfather    Seizures Paternal Grandfather     ALLERGIES:  is allergic to latex.  MEDICATIONS:  Current Outpatient Medications  Medication Sig Dispense Refill   naproxen (NAPROSYN) 375 MG tablet Take 375 mg by mouth 2 (two) times daily with a meal. (Patient not taking: Reported on 11/13/2022)     No current facility-administered medications for this visit.    Review of Systems  Constitutional:  Negative for appetite change, chills, fatigue and fever.  HENT:   Negative for hearing loss and voice change.   Eyes:  Negative for eye problems.  Respiratory:  Negative for chest tightness and cough.   Cardiovascular:  Negative for chest pain.  Gastrointestinal:  Negative for abdominal distention, abdominal pain and blood in stool.  Endocrine: Negative for hot flashes.  Genitourinary:  Negative for difficulty urinating and frequency.   Musculoskeletal:  Negative for arthralgias.  Skin:  Negative for itching and rash.  Neurological:  Negative for extremity weakness.  Hematological:  Negative for adenopathy. Bruises/bleeds easily.  Psychiatric/Behavioral:  Negative for confusion.    PHYSICAL EXAMINATION: ECOG PERFORMANCE STATUS: 0 - Asymptomatic Vitals:   06/25/23 1111  BP: 111/72  Pulse: 72  Resp: 18  Temp: (!) 96.2 F (35.7 C)  SpO2: 100%   Filed Weights   06/25/23 1111  Weight: 164 lb 12.8 oz (74.8 kg)    Physical  Exam Constitutional:      General: She is not in acute distress. HENT:     Head: Normocephalic and atraumatic.  Eyes:     General: No scleral icterus. Cardiovascular:     Rate and Rhythm: Normal rate and regular rhythm.     Heart sounds: Normal heart sounds.  Pulmonary:     Effort: Pulmonary effort is normal. No respiratory distress.  Abdominal:     General: Bowel  sounds are normal. There is no distension.     Palpations: Abdomen is soft.  Musculoskeletal:        General: No deformity. Normal range of motion.     Cervical back: Normal range of motion and neck supple.  Skin:    General: Skin is warm and dry.     Findings: No rash.  Neurological:     Mental Status: She is alert and oriented to person, place, and time. Mental status is at baseline.     Cranial Nerves: No cranial nerve deficit.  Psychiatric:        Mood and Affect: Mood normal.     LABORATORY DATA:  I have reviewed the data as listed    Latest Ref Rng & Units 06/25/2023   12:00 PM 06/23/2023   12:30 AM 06/08/2015    3:57 PM  CBC  WBC 4.0 - 10.5 K/uL 4.4  7.0    Hemoglobin 12.0 - 15.0 g/dL 02.7  25.3    Hematocrit 36.0 - 46.0 % 44.1  41.9  40.4   Platelets 150 - 400 K/uL 220  212        Latest Ref Rng & Units 06/23/2023   12:30 AM  CMP  Glucose 70 - 99 mg/dL 664   BUN 6 - 20 mg/dL 11   Creatinine 4.03 - 1.00 mg/dL 4.74   Sodium 259 - 563 mmol/L 141   Potassium 3.5 - 5.1 mmol/L 3.4   Chloride 98 - 111 mmol/L 105   CO2 22 - 32 mmol/L 26   Calcium 8.9 - 10.3 mg/dL 9.5   Total Protein 6.5 - 8.1 g/dL 7.6   Total Bilirubin 0.3 - 1.2 mg/dL 0.9   Alkaline Phos 38 - 126 U/L 60   AST 15 - 41 U/L 17   ALT 0 - 44 U/L 12       RADIOGRAPHIC STUDIES: I have personally reviewed the radiological images as listed and agreed with the findings in the report. No results found.

## 2023-06-27 LAB — VON WILLEBRAND PANEL
Coagulation Factor VIII: 107 % (ref 56–140)
Ristocetin Co-factor, Plasma: 111 % (ref 50–200)
Von Willebrand Antigen, Plasma: 128 % (ref 50–200)

## 2023-06-27 LAB — COAG STUDIES INTERP REPORT

## 2023-07-16 ENCOUNTER — Inpatient Hospital Stay: Payer: Medicaid Other | Attending: Oncology | Admitting: Oncology

## 2023-07-16 ENCOUNTER — Encounter: Payer: Self-pay | Admitting: Oncology

## 2023-07-16 NOTE — Assessment & Plan Note (Deleted)
Recommend cbc, PT, PTT, platelet function assay, Von Willebrand panel, HIV, Hepatitis, pregnancy test.

## 2023-10-27 ENCOUNTER — Other Ambulatory Visit: Payer: Self-pay

## 2023-10-27 ENCOUNTER — Emergency Department
Admission: EM | Admit: 2023-10-27 | Discharge: 2023-10-27 | Disposition: A | Discharge status: Home / Self Care | Payer: 59 | Attending: Emergency Medicine | Admitting: Emergency Medicine

## 2023-10-27 ENCOUNTER — Encounter: Payer: Self-pay | Admitting: Medical Oncology

## 2023-10-27 DIAGNOSIS — R112 Nausea with vomiting, unspecified: Secondary | ICD-10-CM | POA: Diagnosis not present

## 2023-10-27 DIAGNOSIS — R110 Nausea: Secondary | ICD-10-CM

## 2023-10-27 LAB — POC URINE PREG, ED: Preg Test, Ur: NEGATIVE

## 2023-10-27 LAB — COMPREHENSIVE METABOLIC PANEL
ALT: 14 U/L (ref 0–44)
AST: 15 U/L (ref 15–41)
Albumin: 4.5 g/dL (ref 3.5–5.0)
Alkaline Phosphatase: 58 U/L (ref 38–126)
Anion gap: 10 (ref 5–15)
BUN: 15 mg/dL (ref 6–20)
CO2: 23 mmol/L (ref 22–32)
Calcium: 9.2 mg/dL (ref 8.9–10.3)
Chloride: 102 mmol/L (ref 98–111)
Creatinine, Ser: 0.72 mg/dL (ref 0.44–1.00)
GFR, Estimated: >60 mL/min (ref >60)
Glucose, Bld: 96 mg/dL (ref 70–99)
Potassium: 3.3 mmol/L — ABNORMAL LOW (ref 3.5–5.1)
Sodium: 135 mmol/L (ref 135–145)
Total Bilirubin: 0.6 mg/dL (ref <1.2)
Total Protein: 7.5 g/dL (ref 6.5–8.1)

## 2023-10-27 LAB — CBC
HCT: 41.9 % (ref 36.0–46.0)
Hemoglobin: 14.2 g/dL (ref 12.0–15.0)
MCH: 31.2 pg (ref 26.0–34.0)
MCHC: 33.9 g/dL (ref 30.0–36.0)
MCV: 92.1 fL (ref 80.0–100.0)
Platelets: 201 10*3/uL (ref 150–400)
RBC: 4.55 MIL/uL (ref 3.87–5.11)
RDW: 13.6 % (ref 11.5–15.5)
WBC: 7.8 10*3/uL (ref 4.0–10.5)
nRBC: 0 % (ref 0.0–0.2)

## 2023-10-27 LAB — URINALYSIS, ROUTINE W REFLEX MICROSCOPIC
Bilirubin Urine: NEGATIVE
Glucose, UA: NEGATIVE mg/dL
Hgb urine dipstick: NEGATIVE
Ketones, ur: NEGATIVE mg/dL
Leukocytes,Ua: NEGATIVE
Nitrite: NEGATIVE
Protein, ur: NEGATIVE mg/dL
Specific Gravity, Urine: 1.019 (ref 1.005–1.030)
pH: 7 (ref 5.0–8.0)

## 2023-10-27 LAB — LIPASE, BLOOD: Lipase: 25 U/L (ref 11–51)

## 2023-10-27 MED ORDER — ONDANSETRON 4 MG PO TBDP
4.0000 mg | ORAL_TABLET | Freq: Once | ORAL | Status: AC
  Administered 2023-10-27: 4 mg via ORAL
  Filled 2023-10-27: qty 1

## 2023-10-27 MED ORDER — ONDANSETRON 4 MG PO TBDP: 4.0000 mg | ORAL_TABLET | Freq: Three times a day (TID) | ORAL | 0 refills | Status: DC | PRN

## 2023-10-27 NOTE — ED Triage Notes (Signed)
Pt reports that she drank alcohol 2 nights ago since then has been having nausea and vomiting. Pt reports she was drinking "lots of liquor".

## 2023-10-27 NOTE — Discharge Instructions (Addendum)
You were evaluated in the ED for nausea and vomiting.  Your blood work is reassuring.  Your urinalysis is normal.  Please include potassium (spinach, kale, banana, orange etc..) in your diet for the next couple of days.  Refrain from alcohol consumption until your symptoms completely resolved.  Stay hydrated and get plenty of rest.  Follow-up with your primary care provider.    A list has been provided for you below. Please go to the following website to schedule new (and existing) patient appointments:   [email or web address]   The following is a list of primary care offices in the area who are accepting new patients at this time.  Please reach out to one of them directly and let them know you would like to schedule an appointment to follow up on an Emergency Department visit, and/or to establish a new primary care provider (PCP).  There are likely other primary care clinics in the are who are accepting new patients, but this is an excellent place to start:  [facility] Lead physician: Dr Shirlee Latch [address] [place], [place] [address] [phone]  [facility] Lead Physician: Dr Alba Cory [address], [place], [place] [address] [phone]  [facility]  Lead Physician: Dr Olevia Perches [address] [place], [place] [address] [phone]  [facility] Lead Physician: Dr Sofie Hartigan [address] [place], [place] [address] [phone]  [facility] Lead Physician: Dr Bari Edward [address] [place], [place], [place] [address] [phone]

## 2023-10-27 NOTE — ED Notes (Signed)
See triage note  Presents with some n/v  States she developed this after drinking ETOH  febrile on arrival

## 2023-10-27 NOTE — ED Provider Notes (Signed)
[facility] Emergency Department Provider Note     Event Date/Time   First MD Initiated Contact with Patient 10/27/23 [phone]     (approximate)   History   Nausea and Emesis   HPI  Laurie Patterson is a 22 y.o. female presents to the ED for evaluation of nausea and vomiting.  Patient reports she drank a lot of alcohol over the weekend celebrating her birthday and has since been nauseous.  She denies other symptoms.  She has taken Aleve with some relief.  Denies fever or injury.     Physical Exam   Triage Vital Signs: ED Triage Vitals  Encounter Vitals Group     BP 10/27/23 0825 126/86     Systolic BP Percentile --      Diastolic BP Percentile --      Pulse Rate 10/27/23 0825 80     Resp 10/27/23 0825 16     Temp 10/27/23 0825 97.6 F (36.4 C)     Temp Source 10/27/23 0825 Oral     SpO2 10/27/23 0825 100 %     Weight 10/27/23 0826 165 lb (74.8 kg)     Height 10/27/23 0826 5\' 8"  (1.727 m)     Head Circumference --      Peak Flow --      Pain Score 10/27/23 0826 0     Pain Loc --      Pain Education --      Exclude from Growth Chart --     Most recent vital signs: Vitals:   10/27/23 0825  BP: 126/86  Pulse: 80  Resp: 16  Temp: 97.6 F (36.4 C)  SpO2: 100%    General: Well appearing. Alert and oriented. INAD.  Brisk capillary refills Skin:  Warm, dry and intact. No rashes or lesions noted.  Good skin turgor.   Head:  NCAT.  Eyes:  PERRLA. EOMI.  CV:  Good peripheral perfusion. RRR. No peripheral edema.  RESP:  Normal effort. LCTAB. No retractions.  ABD:  No distention. Soft, Non tender. No masses or organomegaly.  MSK:   Full ROM in all joints. No swelling, deformity or tenderness.  NEURO: Cranial nerves II-XII intact. No focal deficits. Sensation and motor function intact.  Gait is steady   ED Results / Procedures / Treatments   Labs (all labs ordered are listed, but only abnormal results are displayed) Labs Reviewed   COMPREHENSIVE METABOLIC PANEL - Abnormal; Notable for the following components:      Result Value   Potassium 3.3 (*)    All other components within normal limits  URINALYSIS, ROUTINE W REFLEX MICROSCOPIC - Abnormal; Notable for the following components:   Color, Urine YELLOW (*)    APPearance HAZY (*)    All other components within normal limits  LIPASE, BLOOD  CBC  POC URINE PREG, ED   No results found.  PROCEDURES:  Critical Care performed: No  Procedures  MEDICATIONS ORDERED IN ED: Medications  ondansetron (ZOFRAN-ODT) disintegrating tablet 4 mg (4 mg Oral Given 10/27/23 0907)    IMPRESSION / MDM / ASSESSMENT AND PLAN / ED COURSE  I reviewed the triage vital signs and the nursing notes.                               22 y.o. female presents to the emergency department for evaluation and treatment of nausea & vomiting. See HPI for  further details.   Differential diagnosis includes, but is not limited to alcohol induced nausea, pregnancy, gastroenteritis  Patient's presentation is most consistent with acute complicated illness / injury requiring diagnostic workup.  Patient is alert and oriented.  She is hemodynamically stable and afebrile.  Physical exam findings are overall benign as stated above.  Patient is able to tolerate fluids p.o. she is given Zofran in ED.  Labs are reassuring.  Urinalysis is normal.  Pregnancy test is negative.   The patient is in stable and satisfactory condition for discharge home. Encouraged to follow up with PCP as needed for further management. ED precautions discussed.  Encourage cessation of alcohol use. All questions and concerns were addressed during this ED visit.     FINAL CLINICAL IMPRESSION(S) / ED DIAGNOSES   Final diagnoses:  Nausea   Rx / DC Orders   ED Discharge Orders          Ordered    ondansetron (ZOFRAN-ODT) 4 MG disintegrating tablet  Every 8 hours PRN        10/27/23 0953            Note:  This document  was prepared using Dragon voice recognition software and may include unintentional dictation errors.    Kern Reap A, PA-C 10/27/23 1001    Jene Every, MD 10/27/23 1002

## 2023-12-09 ENCOUNTER — Ambulatory Visit: Payer: 59 | Admitting: Nurse Practitioner

## 2023-12-09 NOTE — Progress Notes (Deleted)
 There were no vitals taken for this visit.   Subjective:    Patient ID: Laurie Patterson, female    DOB: 08-26-2001, 22 y.o.   MRN: [id number]  HPI: Laurie Patterson is a 23 y.o. female  No chief complaint on file.   Discussed the use of AI scribe software for clinical note transcription with the patient, who gave verbal consent to proceed.  History of Present Illness           03/03/2023   11:04 AM 11/13/2022    8:39 AM 09/26/2021    2:30 PM  Depression screen PHQ 2/9  Decreased Interest 0 0 1  Down, Depressed, Hopeless 0 0 0  PHQ - 2 Score 0 0 1    Relevant past medical, surgical, family and social history reviewed and updated as indicated. Interim medical history since our last visit reviewed. Allergies and medications reviewed and updated.  Review of Systems  Per HPI unless specifically indicated above     Objective:    There were no vitals taken for this visit.  {Vitals History (Optional):23777} Wt Readings from Last 3 Encounters:  10/27/23 165 lb (74.8 kg) (90%, Z= 1.25)*  06/25/23 164 lb 12.8 oz (74.8 kg) (90%, Z= 1.27)*  06/23/23 163 lb (73.9 kg) (89%, Z= 1.22)*   * Growth percentiles are based on CDC (Girls, 2-20 Years) data.    Physical Exam  Results for orders placed or performed during the hospital encounter of 10/27/23  Urinalysis, Routine w reflex microscopic -Urine, Clean Catch   Collection Time: 10/27/23  8:27 AM  Result Value Ref Range   Color, Urine YELLOW (A) YELLOW   APPearance HAZY (A) CLEAR   Specific Gravity, Urine 1.019 1.005 - 1.030   pH 7.0 5.0 - 8.0   Glucose, UA NEGATIVE NEGATIVE mg/dL   Hgb urine dipstick NEGATIVE NEGATIVE   Bilirubin Urine NEGATIVE NEGATIVE   Ketones, ur NEGATIVE NEGATIVE mg/dL   Protein, ur NEGATIVE NEGATIVE mg/dL   Nitrite NEGATIVE NEGATIVE   Leukocytes,Ua NEGATIVE NEGATIVE  Lipase, blood   Collection Time: 10/27/23  8:29 AM  Result Value Ref Range   Lipase 25 11 - 51 U/L  Comprehensive metabolic  panel   Collection Time: 10/27/23  8:29 AM  Result Value Ref Range   Sodium 135 135 - 145 mmol/L   Potassium 3.3 (L) 3.5 - 5.1 mmol/L   Chloride 102 98 - 111 mmol/L   CO2 23 22 - 32 mmol/L   Glucose, Bld 96 70 - 99 mg/dL   BUN 15 6 - 20 mg/dL   Creatinine, Ser 9.27 0.44 - 1.00 mg/dL   Calcium 9.2 8.9 - 89.6 mg/dL   Total Protein 7.5 6.5 - 8.1 g/dL   Albumin 4.5 3.5 - 5.0 g/dL   AST 15 15 - 41 U/L   ALT 14 0 - 44 U/L   Alkaline Phosphatase 58 38 - 126 U/L   Total Bilirubin 0.6 <1.2 mg/dL   GFR, Estimated >39 >39 mL/min   Anion gap 10 5 - 15  CBC   Collection Time: 10/27/23  8:29 AM  Result Value Ref Range   WBC 7.8 4.0 - 10.5 K/uL   RBC 4.55 3.87 - 5.11 MIL/uL   Hemoglobin 14.2 12.0 - 15.0 g/dL   HCT 58.0 63.9 - 53.9 %   MCV 92.1 80.0 - 100.0 fL   MCH 31.2 26.0 - 34.0 pg   MCHC 33.9 30.0 - 36.0 g/dL   RDW 86.3 88.4 -  15.5 %   Platelets 201 150 - 400 K/uL   nRBC 0.0 0.0 - 0.2 %  POC urine preg, ED   Collection Time: 10/27/23  9:36 AM  Result Value Ref Range   Preg Test, Ur Negative Negative   {Labs (Optional):23779}    Assessment & Plan:   Problem List Items Addressed This Visit   None    Assessment and Plan             Follow up plan: No follow-ups on file.

## 2023-12-19 ENCOUNTER — Encounter: Payer: Self-pay | Admitting: Nurse Practitioner

## 2023-12-19 ENCOUNTER — Ambulatory Visit: Payer: 59 | Admitting: Nurse Practitioner

## 2023-12-19 DIAGNOSIS — B9689 Other specified bacterial agents as the cause of diseases classified elsewhere: Secondary | ICD-10-CM

## 2023-12-19 DIAGNOSIS — Z113 Encounter for screening for infections with a predominantly sexual mode of transmission: Secondary | ICD-10-CM

## 2023-12-19 LAB — WET PREP FOR TRICH, YEAST, CLUE
Trichomonas Exam: NEGATIVE
Yeast Exam: NEGATIVE

## 2023-12-19 LAB — HM HIV SCREENING LAB: HM HIV Screening: NEGATIVE

## 2023-12-19 MED ORDER — METRONIDAZOLE 500 MG PO TABS: 500.0000 mg | ORAL_TABLET | Freq: Two times a day (BID) | ORAL | Status: AC

## 2023-12-19 NOTE — Progress Notes (Addendum)
Pt is here for STD screening. The patient was dispensed Metronidazole 500 mg 2x/day for 7 days today. I provided counseling today regarding the medication. We discussed the medication, the side effects and when to call clinic. Patient given the opportunity to ask questions for any clarification. Condoms and brochure given. Sonda Primes, RN.
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
## 2023-12-19 NOTE — Addendum Note (Signed)
Addended by: Sonda Primes on: 12/19/2023 11:23 AM   Modules accepted: Orders

## 2024-02-11 ENCOUNTER — Ambulatory Visit: Payer: Self-pay

## 2024-02-11 VITALS — BP 125/60 | Ht 67.0 in | Wt 183.0 lb

## 2024-02-11 DIAGNOSIS — Z3202 Encounter for pregnancy test, result negative: Secondary | ICD-10-CM

## 2024-02-11 DIAGNOSIS — Z309 Encounter for contraceptive management, unspecified: Secondary | ICD-10-CM

## 2024-02-11 LAB — PREGNANCY, URINE: Preg Test, Ur: NEGATIVE

## 2024-02-11 NOTE — Progress Notes (Signed)
 UPT negative. LMP 01/04/2024. Last sex 02/07/2024. Not on [facility] as patient is currently seeking pregnancy. Advised patient to take at home pregnancy test in approx. 2 weeks if period does not show. Also advised patient to establish care at an OBGYN. Patient agrees and verbalizes understanding.   Negative pregnancy packet reviewed and given to patient.   Abagail Kitchens, RN

## 2024-04-28 ENCOUNTER — Encounter: Payer: Self-pay | Admitting: Family Medicine

## 2024-04-28 ENCOUNTER — Ambulatory Visit: Payer: Self-pay | Admitting: Family Medicine

## 2024-04-28 DIAGNOSIS — B379 Candidiasis, unspecified: Secondary | ICD-10-CM

## 2024-04-28 DIAGNOSIS — Z113 Encounter for screening for infections with a predominantly sexual mode of transmission: Secondary | ICD-10-CM

## 2024-04-28 LAB — WET PREP FOR TRICH, YEAST, CLUE
Clue Cell Exam: NEGATIVE
Trichomonas Exam: NEGATIVE

## 2024-04-28 LAB — HM HIV SCREENING LAB: HM HIV Screening: NEGATIVE

## 2024-04-28 MED ORDER — CLOTRIMAZOLE 1 % VA CREA: 1.0000 | TOPICAL_CREAM | Freq: Every day | VAGINAL | Status: AC

## 2024-04-28 NOTE — Progress Notes (Signed)
 Pt is here for STD screening. Wet prep reviewed with patient. The patient was dispensed Clotrimazole 1% Vaginal Cream Once/day for 7 days today. I provided counseling today regarding the cream and how to apply. Patient given the opportunity to ask questions for any clarification. Condoms given. Wilkie Drought, RN.
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
## 2024-05-12 ENCOUNTER — Encounter: Payer: Self-pay | Admitting: Family Medicine

## 2024-05-31 ENCOUNTER — Emergency Department
Admission: EM | Admit: 2024-05-31 | Discharge: 2024-05-31 | Disposition: A | Discharge status: Home / Self Care | Payer: Self-pay | Attending: Emergency Medicine | Admitting: Emergency Medicine

## 2024-05-31 ENCOUNTER — Emergency Department: Payer: Self-pay

## 2024-05-31 ENCOUNTER — Other Ambulatory Visit: Payer: Self-pay

## 2024-05-31 DIAGNOSIS — W231XXA Caught, crushed, jammed, or pinched between stationary objects, initial encounter: Secondary | ICD-10-CM | POA: Insufficient documentation

## 2024-05-31 DIAGNOSIS — Y990 Civilian activity done for income or pay: Secondary | ICD-10-CM | POA: Insufficient documentation

## 2024-05-31 DIAGNOSIS — S93402A Sprain of unspecified ligament of left ankle, initial encounter: Secondary | ICD-10-CM | POA: Insufficient documentation

## 2024-05-31 NOTE — ED Notes (Signed)
 See triage note   Presents with pain to left ankle  States she got it caught in a palate on Thursday Min swelling   Good pulses

## 2024-05-31 NOTE — ED Provider Notes (Signed)
 [facility] Provider Note    Event Date/Time   First MD Initiated Contact with Patient 05/31/24 0848     (approximate)   History   Ankle Pain   HPI  Laurie Patterson is a 23 y.o. female   presents to the ED with complaint of left ankle foot pain after an injury that occurred at work.  Patient states that she was reaching up to get merchandise and stepped up a pallet.  When she got ready to get down she did not realize that her foot was caught in the hole of the pallet causing her to twist.  This occurred 4 days ago.  Patient does not wish to file Workmen's Comp.  She has taken ibuprofen since the accident.  She has continued to ambulate.  Patient does have a history of asthma.      Physical Exam   Triage Vital Signs: ED Triage Vitals  Encounter Vitals Group     BP 05/31/24 0832 (!) 108/93     Girls Systolic BP Percentile --      Girls Diastolic BP Percentile --      Boys Systolic BP Percentile --      Boys Diastolic BP Percentile --      Pulse Rate 05/31/24 0832 67     Resp 05/31/24 0832 18     Temp 05/31/24 0832 97.9 F (36.6 C)     Temp Source 05/31/24 0832 Oral     SpO2 05/31/24 0832 97 %     Weight 05/31/24 0829 182 lb 15.7 oz (83 kg)     Height 05/31/24 0829 5' 7 (1.702 m)     Head Circumference --      Peak Flow --      Pain Score 05/31/24 0830 8     Pain Loc --      Pain Education --      Exclude from Growth Chart --     Most recent vital signs: Vitals:   05/31/24 0832  BP: (!) 108/93  Pulse: 67  Resp: 18  Temp: 97.9 F (36.6 C)  SpO2: 97%     General: Awake, no distress.  CV:  Good peripheral perfusion.  Resp:  Normal effort.  Abd:  No distention.  Other:  Left ankle with minimal soft tissue edema and no bruising, ecchymosis or abrasions noted.  Minimal tenderness on palpation of the lateral aspect.  Range of motion is without restriction.  Pulses are present.  Skin is intact.  Digits without injury distal to the  injury.   ED Results / Procedures / Treatments   Labs (all labs ordered are listed, but only abnormal results are displayed) Labs Reviewed - No data to display    RADIOLOGY Left ankle x-ray images were reviewed and interpreted by self independent of the radiologist and was negative for fracture or dislocation.  Official radiology report is negative.    PROCEDURES:  Critical Care performed:   Procedures   MEDICATIONS ORDERED IN ED: Medications - No data to display   IMPRESSION / MDM / ASSESSMENT AND PLAN / ED COURSE  I reviewed the triage vital signs and the nursing notes.   Differential diagnosis includes, but is not limited to, left ankle sprain, strain, fracture, dislocation.  23 year old female presents to the ED with complaint of left ankle injury that occurred 4 days ago.  Patient was at work when this occurred however she does not wish to file this is Workmen's Comp.  X-rays were negative and reassuring.  Patient was told to continue with ibuprofen, ice and elevation if needed for swelling and an Ace wrap was applied.  Patient was given a note for work today.  She has to follow-up with podiatry if any continued problems or not improving.      Patient's presentation is most consistent with acute complicated illness / injury requiring diagnostic workup.  FINAL CLINICAL IMPRESSION(S) / ED DIAGNOSES   Final diagnoses:  Sprain of left ankle, unspecified ligament, initial encounter     Rx / DC Orders   ED Discharge Orders     None        Note:  This document was prepared using Dragon voice recognition software and may include unintentional dictation errors.   Saunders Shona CROME, PA-C 05/31/24 1142    Claudene Rover, MD 05/31/24 [phone]

## 2024-05-31 NOTE — Discharge Instructions (Signed)
 Follow-up with Dr. Malvin who is on-call for podiatry if your ankle is not continuing to improve or you continue to have problems with it.  Ice and elevation if needed for any swelling.  An Ace wrap was applied to your ankle while in the ED for support and protection.  Continue with the ibuprofen 3 tablets with food 3 times a day.  Make sure that you are wearing supportive shoes which also will keep your foot from twisting and irritating your injury.

## 2024-05-31 NOTE — ED Triage Notes (Signed)
 Pt states she got her L foot/ankle stuck in between 2 pallets, no deformity noted. Pt ambulatory to triage with steady gait.

## 2024-06-06 ENCOUNTER — Other Ambulatory Visit: Payer: Self-pay

## 2024-06-06 ENCOUNTER — Emergency Department
Admission: EM | Admit: 2024-06-06 | Discharge: 2024-06-06 | Disposition: A | Discharge status: Home / Self Care | Payer: Self-pay

## 2024-06-06 ENCOUNTER — Emergency Department: Payer: Self-pay

## 2024-06-06 DIAGNOSIS — J45909 Unspecified asthma, uncomplicated: Secondary | ICD-10-CM | POA: Insufficient documentation

## 2024-06-06 DIAGNOSIS — W1789XA Other fall from one level to another, initial encounter: Secondary | ICD-10-CM | POA: Insufficient documentation

## 2024-06-06 DIAGNOSIS — S93402A Sprain of unspecified ligament of left ankle, initial encounter: Secondary | ICD-10-CM | POA: Insufficient documentation

## 2024-06-06 MED ORDER — IBUPROFEN 800 MG PO TABS
800.0000 mg | ORAL_TABLET | Freq: Once | ORAL | Status: AC
  Administered 2024-06-06: 800 mg via ORAL
  Filled 2024-06-06: qty 1

## 2024-06-06 MED ORDER — DICLOFENAC SODIUM 1 % EX GEL: 4.0000 g | Freq: Four times a day (QID) | CUTANEOUS | 0 refills | Status: AC | PRN

## 2024-06-06 MED ORDER — CYCLOBENZAPRINE HCL 10 MG PO TABS: 10.0000 mg | ORAL_TABLET | Freq: Three times a day (TID) | ORAL | 0 refills | Status: AC | PRN

## 2024-06-06 MED ORDER — MELOXICAM 15 MG PO TABS
15.0000 mg | ORAL_TABLET | Freq: Every day | ORAL | 0 refills | Status: AC
Start: 2024-06-06 — End: 2024-06-20
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
## 2024-06-06 NOTE — Discharge Instructions (Addendum)
 You have been diagnosed with left ankle sprain.  Please keep elevated the left ankle, apply ice pack.  Please wear the boot and use the crutches.  Please take Flexeril  1 tablet by mouth before bed time.  Please avoid driving while taking Flexeril .  Please take meloxicam  1 tablet by mouth with breakfast.  You can apply Voltaren  gel every 6 hours as needed for pain.  You can call Dr. Edie  and make an appointment for a follow-up.  Please come back to ED or go to your PCP if you have new symptoms or symptoms worsen
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
## 2024-06-07 ENCOUNTER — Telehealth: Payer: Self-pay | Admitting: Physician Assistant

## 2024-06-07 NOTE — Telephone Encounter (Cosign Needed)
 Patient called to the ED requesting a return to work note without restrictions, following her recent visit to the ED.  Work note was created and printed as requested.

## 2024-07-28 ENCOUNTER — Other Ambulatory Visit: Payer: Self-pay

## 2024-07-28 ENCOUNTER — Emergency Department
Admission: EM | Admit: 2024-07-28 | Discharge: 2024-07-28 | Disposition: A | Discharge status: Home / Self Care | Payer: Self-pay | Attending: Emergency Medicine | Admitting: Emergency Medicine

## 2024-07-28 DIAGNOSIS — M79601 Pain in right arm: Secondary | ICD-10-CM | POA: Insufficient documentation

## 2024-07-28 NOTE — ED Triage Notes (Signed)
 Needs a doctor's note for missing due to hurting arm 2 weeks ago.  No current compalints

## 2024-07-28 NOTE — Discharge Instructions (Signed)
 Follow-up with your primary care provider, urgent care or make an appointment to see the orthopedist that was done on your discharge papers if you continue to have any problems with your shoulder.

## 2024-07-28 NOTE — ED Provider Notes (Signed)
 [facility] Provider Note    Event Date/Time   First MD Initiated Contact with Patient 07/28/24 0940     (approximate)   History   No chief complaint on file.   HPI  Laurie Patterson is a 23 y.o. female   presents to the ED for a doctor's note to return to full duty.  Patient states that she was seen elsewhere but does not remember the name of the clinic or urgent care where she was seen for her right shoulder pain.  She initially thought this would be covered under Workmen's Comp. which it was not.  Patient reports that there is no pain and has no restrictions with range of motion.  Patient states that work requires that she has a note stating that she can return to work.      Physical Exam   Triage Vital Signs: ED Triage Vitals [07/28/24 0930]  Encounter Vitals Group     BP      Girls Systolic BP Percentile      Girls Diastolic BP Percentile      Boys Systolic BP Percentile      Boys Diastolic BP Percentile      Pulse      Resp      Temp      Temp src      SpO2      Weight 179 lb 14.3 oz (81.6 kg)     Height      Head Circumference      Peak Flow      Pain Score 0     Pain Loc      Pain Education      Exclude from Growth Chart     Most recent vital signs: Vitals:   07/28/24 1014  BP: 105/67  Pulse: 66  Resp: 16  Temp: 98.9 F (37.2 C)  SpO2: 100%     General: Awake, no distress.  CV:  Good peripheral perfusion.  Resp:  Normal effort.  Abd:  No distention.  Other:  Right shoulder without deformity.  No tenderness on palpation.  Range of motion is without restriction and patient can fully extend above her head, abduct and adduct without any difficulty.   ED Results / Procedures / Treatments   Labs (all labs ordered are listed, but only abnormal results are displayed) Labs Reviewed - No data to display   PROCEDURES:  Critical Care performed:   Procedures   MEDICATIONS ORDERED IN ED: Medications - No data to  display   IMPRESSION / MDM / ASSESSMENT AND PLAN / ED COURSE  I reviewed the triage vital signs and the nursing notes.   Differential diagnosis includes, but is not limited to, right shoulder pain resolved, encounter for return to work note.  23 year old female presents to the ED for a note to return to work.  Patient was seen at an urgent care for what was felt to be a Workmen's Comp. injury at the time.  It was determined that this was not Workmen's Comp. and the urgent care that she went to would not write a note stating that she could return to work without any restrictions.  Physical exam is consistent with resolved right shoulder pain.  Note was given for her to return to work.      Patient's presentation is most consistent with acute, uncomplicated illness.  FINAL CLINICAL IMPRESSION(S) / ED DIAGNOSES   Final diagnoses:  Musculoskeletal pain of right upper extremity  Rx / DC Orders   ED Discharge Orders     None        Note:  This document was prepared using Dragon voice recognition software and may include unintentional dictation errors.   Saunders Shona CROME, PA-C 07/28/24 1515    Arlander Charleston, MD 07/28/24 [phone]

## 2024-10-04 ENCOUNTER — Other Ambulatory Visit: Payer: Self-pay

## 2024-10-04 ENCOUNTER — Encounter: Payer: Self-pay | Admitting: Emergency Medicine

## 2024-10-04 DIAGNOSIS — B349 Viral infection, unspecified: Secondary | ICD-10-CM | POA: Insufficient documentation

## 2024-10-04 DIAGNOSIS — J45909 Unspecified asthma, uncomplicated: Secondary | ICD-10-CM | POA: Insufficient documentation

## 2024-10-04 NOTE — ED Triage Notes (Signed)
 Pt arrives via EMS reporting that she would like a pregnancy test due to symptoms that began around 1pm today--breast soreness and HA

## 2024-10-05 ENCOUNTER — Emergency Department
Admission: EM | Admit: 2024-10-05 | Discharge: 2024-10-05 | Disposition: A | Payer: Self-pay | Attending: Emergency Medicine | Admitting: Emergency Medicine

## 2024-10-05 DIAGNOSIS — B349 Viral infection, unspecified: Secondary | ICD-10-CM

## 2024-10-05 LAB — RESP PANEL BY RT-PCR (RSV, FLU A&B, COVID)  RVPGX2
Influenza A by PCR: NEGATIVE
Influenza B by PCR: NEGATIVE
Resp Syncytial Virus by PCR: NEGATIVE
SARS Coronavirus 2 by RT PCR: NEGATIVE

## 2024-10-05 LAB — POC URINE PREG, ED: Preg Test, Ur: NEGATIVE

## 2024-10-05 MED ORDER — ACETAMINOPHEN 500 MG PO TABS
1000.0000 mg | ORAL_TABLET | Freq: Once | ORAL | Status: AC
  Administered 2024-10-05: 1000 mg via ORAL
  Filled 2024-10-05: qty 2

## 2024-10-05 NOTE — ED Provider Notes (Signed)
 [facility] Provider Note    Event Date/Time   First MD Initiated Contact with Patient 10/05/24 0020     (approximate)   History   Headache   HPI  Laurie Patterson is a 23 y.o. female with a history of asthma who presents with headache, bodyaches, nausea but no vomiting, breast soreness, and malaise since about 1 PM today.  She denies any cough or shortness of breath.  She has no diarrhea.  She denies any chest pain or fever.  She was concerned she might be pregnant, or might have the flu.  I reviewed the past medical records.  The patient has had a few ED visits over the last several months due to unrelated symptoms.  Her other most recent outpatient encounter was STD testing at the [facility] in June.   Physical Exam   Triage Vital Signs: ED Triage Vitals  Encounter Vitals Group     BP 10/04/24 2315 112/67     Girls Systolic BP Percentile --      Girls Diastolic BP Percentile --      Boys Systolic BP Percentile --      Boys Diastolic BP Percentile --      Pulse Rate 10/04/24 2315 90     Resp 10/04/24 2315 18     Temp 10/04/24 2315 98.4 F (36.9 C)     Temp Source 10/04/24 2315 Oral     SpO2 10/04/24 2315 100 %     Weight 10/04/24 2310 170 lb (77.1 kg)     Height 10/04/24 2310 5' 8 (1.727 m)     Head Circumference --      Peak Flow --      Pain Score 10/04/24 2310 8     Pain Loc --      Pain Education --      Exclude from Growth Chart --     Most recent vital signs: Vitals:   10/04/24 2315  BP: 112/67  Pulse: 90  Resp: 18  Temp: 98.4 F (36.9 C)  SpO2: 100%     General: Alert, well-appearing, no distress.  CV:  Good peripheral perfusion.  Resp:  Normal effort.  Lungs CTAB. Abd:  No distention.  Other:  Moist mucous membranes.   ED Results / Procedures / Treatments   Labs (all labs ordered are listed, but only abnormal results are displayed) Labs Reviewed  RESP PANEL BY RT-PCR (RSV, FLU A&B,  COVID)  [id number]  POC URINE PREG, ED     EKG    RADIOLOGY    PROCEDURES:  Critical Care performed: No  Procedures   MEDICATIONS ORDERED IN ED: Medications  acetaminophen  (TYLENOL ) tablet 1,000 mg (1,000 mg Oral Given 10/05/24 0110)     IMPRESSION / MDM / ASSESSMENT AND PLAN / ED COURSE  I reviewed the triage vital signs and the nursing notes.  23 year old female with with PMH as noted presents with symptoms as above requesting a pregnancy test.  She also wants to be checked for flu.  On exam she is well-appearing with normal vital signs.  Physical exam is unremarkable for other acute findings.  Differential diagnosis includes, but is not limited to, influenza, COVID, other viral syndrome.  Pregnancy test is negative.  Will obtain a respiratory panel and reassess.  Patient's presentation is most consistent with acute complicated illness / injury requiring diagnostic workup.  ----------------------------------------- 2:21 AM on 10/05/2024 -----------------------------------------  Respiratory panel is negative.  The patient continues  to appear well and is stable for discharge home.  I counseled her on the results of the workup and plan of care.  I gave strict return precautions, she expressed understanding.   FINAL CLINICAL IMPRESSION(S) / ED DIAGNOSES   Final diagnoses:  Viral syndrome     Rx / DC Orders   ED Discharge Orders     None        Note:  This document was prepared using Dragon voice recognition software and may include unintentional dictation errors.    Jacolyn Pae, MD 10/05/24 0221

## 2024-10-05 NOTE — ED Notes (Signed)
 Pt discharged to home.  AVS reviewed and pt verbalized understanding.  Pt voices on questions at this time.

## 2024-10-15 ENCOUNTER — Ambulatory Visit

## 2024-10-15 VITALS — BP 127/58 | Ht 68.0 in | Wt 184.5 lb

## 2024-10-15 DIAGNOSIS — Z3200 Encounter for pregnancy test, result unknown: Secondary | ICD-10-CM

## 2024-10-15 DIAGNOSIS — Z3009 Encounter for other general counseling and advice on contraception: Secondary | ICD-10-CM | POA: Diagnosis not present

## 2024-10-15 LAB — PREGNANCY, URINE

## 2024-10-15 MED ORDER — PRENATAL 27-0.8 MG PO TABS: 1.0000 | ORAL_TABLET | Freq: Every day | ORAL | Status: AC

## 2024-10-15 NOTE — Progress Notes (Signed)
 Pt came in today got UPT. The line was very faint-HCG level near threshold for test ,and lab recommends to return in 48-72hrs to repeat UPT for confirmation. Per pt she has taken 3 at home tests that read positive. Pt is in agreement to return Monday to repeat test.Completed interview. Advised pt would go over positive pregnancy test packet and give PNV when returns.

## 2024-10-17 ENCOUNTER — Emergency Department
Admission: EM | Admit: 2024-10-17 | Discharge: 2024-10-17 | Disposition: A | Discharge status: Home / Self Care | Payer: Self-pay | Attending: Emergency Medicine | Admitting: Emergency Medicine

## 2024-10-17 ENCOUNTER — Other Ambulatory Visit: Payer: Self-pay

## 2024-10-17 ENCOUNTER — Emergency Department: Payer: Self-pay

## 2024-10-17 DIAGNOSIS — O039 Complete or unspecified spontaneous abortion without complication: Secondary | ICD-10-CM | POA: Insufficient documentation

## 2024-10-17 DIAGNOSIS — J45909 Unspecified asthma, uncomplicated: Secondary | ICD-10-CM | POA: Insufficient documentation

## 2024-10-17 LAB — URINALYSIS, ROUTINE W REFLEX MICROSCOPIC
Bilirubin Urine: NEGATIVE
Glucose, UA: NEGATIVE mg/dL
Ketones, ur: NEGATIVE mg/dL
Leukocytes,Ua: NEGATIVE
Nitrite: NEGATIVE
Protein, ur: NEGATIVE mg/dL
Specific Gravity, Urine: 1.019 (ref 1.005–1.030)
pH: 6 (ref 5.0–8.0)

## 2024-10-17 LAB — CBC WITH DIFFERENTIAL/PLATELET
Abs Immature Granulocytes: 0.01 K/uL (ref 0.00–0.07)
Basophils Absolute: 0 K/uL (ref 0.0–0.1)
Basophils Relative: 1 %
Eosinophils Absolute: 0.1 K/uL (ref 0.0–0.5)
Eosinophils Relative: 2 %
HCT: 44.6 % (ref 36.0–46.0)
Hemoglobin: 14.8 g/dL (ref 12.0–15.0)
Immature Granulocytes: 0 %
Lymphocytes Relative: 36 %
Lymphs Abs: 2.2 K/uL (ref 0.7–4.0)
MCH: 30.3 pg (ref 26.0–34.0)
MCHC: 33.2 g/dL (ref 30.0–36.0)
MCV: 91.4 fL (ref 80.0–100.0)
Monocytes Absolute: 0.4 K/uL (ref 0.1–1.0)
Monocytes Relative: 7 %
Neutro Abs: 3.4 K/uL (ref 1.7–7.7)
Neutrophils Relative %: 54 %
Platelets: 251 K/uL (ref 150–400)
RBC: 4.88 MIL/uL (ref 3.87–5.11)
RDW: 13.7 % (ref 11.5–15.5)
WBC: 6.2 K/uL (ref 4.0–10.5)
nRBC: 0 % (ref 0.0–0.2)

## 2024-10-17 LAB — ABO/RH: ABO/RH(D): A POS

## 2024-10-17 LAB — BASIC METABOLIC PANEL WITH GFR
Anion gap: 11 (ref 5–15)
BUN: 13 mg/dL (ref 6–20)
CO2: 26 mmol/L (ref 22–32)
Calcium: 10.2 mg/dL (ref 8.9–10.3)
Chloride: 104 mmol/L (ref 98–111)
Creatinine, Ser: 0.73 mg/dL (ref 0.44–1.00)
GFR, Estimated: >60 mL/min (ref >60)
Glucose, Bld: 84 mg/dL (ref 70–99)
Potassium: 4 mmol/L (ref 3.5–5.1)
Sodium: 141 mmol/L (ref 135–145)

## 2024-10-17 LAB — POC URINE PREG, ED: Preg Test, Ur: NEGATIVE

## 2024-10-17 LAB — HCG, QUANTITATIVE, PREGNANCY: hCG, Beta Chain, Quant, S: 6 m[IU]/mL — ABNORMAL HIGH (ref <5)

## 2024-10-17 MED ORDER — ACETAMINOPHEN 325 MG PO TABS
650.0000 mg | ORAL_TABLET | Freq: Once | ORAL | Status: AC
  Administered 2024-10-17: 650 mg via ORAL
  Filled 2024-10-17: qty 2

## 2024-10-17 NOTE — ED Notes (Signed)
 Pt returned from [place]

## 2024-10-17 NOTE — ED Notes (Signed)
 Pt to [place].

## 2024-10-17 NOTE — ED Provider Notes (Signed)
 [facility] Provider Note    Event Date/Time   First MD Initiated Contact with Patient 10/17/24 1544     (approximate)   History   Vaginal Bleeding   HPI  Laurie Patterson is a 23 y.o. female with PMH of asthma and depression presents for evaluation of possible miscarriage.  Patient states she is about [redacted] weeks pregnant which was confirmed at the health department.  She began having some cramping and bleeding this morning.  She has not noticed any clots or tissue.  No dizziness.      Physical Exam   Triage Vital Signs: ED Triage Vitals  Encounter Vitals Group     BP 10/17/24 1427 120/77     Girls Systolic BP Percentile --      Girls Diastolic BP Percentile --      Boys Systolic BP Percentile --      Boys Diastolic BP Percentile --      Pulse Rate 10/17/24 1427 70     Resp 10/17/24 1427 20     Temp 10/17/24 1427 97.7 F (36.5 C)     Temp Source 10/17/24 1427 Oral     SpO2 10/17/24 1427 100 %     Weight 10/17/24 1428 180 lb (81.6 kg)     Height 10/17/24 1428 5' 8 (1.727 m)     Head Circumference --      Peak Flow --      Pain Score 10/17/24 1427 8     Pain Loc --      Pain Education --      Exclude from Growth Chart --     Most recent vital signs: Vitals:   10/17/24 1427  BP: 120/77  Pulse: 70  Resp: 20  Temp: 97.7 F (36.5 C)  SpO2: 100%   General: Awake, no distress.  CV:  Good peripheral perfusion.  RRR. Resp:  Normal effort.  CTAB. Abd:  No distention.  Soft, mild tenderness to palpation across the lower abdomen. Other:     ED Results / Procedures / Treatments   Labs (all labs ordered are listed, but only abnormal results are displayed) Labs Reviewed  URINALYSIS, ROUTINE W REFLEX MICROSCOPIC - Abnormal; Notable for the following components:      Result Value   Color, Urine YELLOW (*)    APPearance HAZY (*)    Hgb urine dipstick LARGE (*)    Bacteria, UA MANY (*)    All other components within normal  limits  HCG, QUANTITATIVE, PREGNANCY - Abnormal; Notable for the following components:   hCG, Beta Chain, Quant, S 6 (*)    All other components within normal limits  CBC WITH DIFFERENTIAL/PLATELET  BASIC METABOLIC PANEL WITH GFR  POC URINE PREG, ED  ABO/RH    RADIOLOGY  Pelvic ultrasound obtained, interpreted the images as well as reviewed the radiologist report.  Images did not show any evidence of retained products of conception.  There is no increased endometrial vascularity.  There is a right ovarian corpus luteum cyst which was an expected physiologic finding.  No adnexal masses.  PROCEDURES:  Critical Care performed: No  Procedures   MEDICATIONS ORDERED IN ED: Medications  acetaminophen  (TYLENOL ) tablet 650 mg (650 mg Oral Given 10/17/24 1608)     IMPRESSION / MDM / ASSESSMENT AND PLAN / ED COURSE  I reviewed the triage vital signs and the nursing notes.  23 year old female presents for evaluation of vaginal bleeding.  Vital signs are stable patient NAD on exam.  Differential diagnosis includes, but is not limited to, threatened abortion, subchorionic hemorrhage, miscarriage, implantation bleeding, uterine fibroids.  Patient's presentation is most consistent with acute complicated illness / injury requiring diagnostic workup.  Will obtain labs, urinalysis and pelvic ultrasound.  CBC is unremarkable, no anemia or leukocytosis.  BMP is unremarkable.  Blood type is a positive, so she does not need RhoGAM.  Urine pregnancy test is negative but beta-hCG is 6.  Urinalysis shows hemoglobin, RBCs, WBCs and many bacteria.  Do not feel that this is consistent with a UTI but is rather contaminant from the bleeding.  Pelvic ultrasound does not show any evidence of retained products of conception.  There is no increased endometrial vascularity.   I do suspect that patient is having a miscarriage given her beta-hCG and no evidence of pregnancy on  ultrasound.  I advised patient to follow-up with the OB/GYN to have her beta trended all the way down to 0.  Patient was given a note for work.  She voiced understanding, all questions were answered and she was stable at discharge.     FINAL CLINICAL IMPRESSION(S) / ED DIAGNOSES   Final diagnoses:  Miscarriage     Rx / DC Orders   ED Discharge Orders     None        Note:  This document was prepared using Dragon voice recognition software and may include unintentional dictation errors.   Cleaster Tinnie LABOR, PA-C 10/17/24 TRENNA Jacolyn Pae, MD 10/17/24 2009

## 2024-10-17 NOTE — Discharge Instructions (Addendum)
 Your blood work was normal today.  Based on the beta-hCG which measures the level of pregnancy hormone and the ultrasound, I believe you are having a miscarriage.  You may have some heavy bleeding over the next few days and may experience spotting for a couple weeks.  Please follow-up with the OB/GYN attached to have your beta-hCG rechecked.  This needs to go all the way down to 0.   Please return to the emergency department with any worsening symptoms.  Watch for heavy bleeding which is defined as feeling a maxi pad within 1 hour for more than 2 hours back-to-back.  I would also recommend returning to the emergency department if you develop any lightheadedness or dizziness with this bleeding.

## 2024-10-17 NOTE — ED Triage Notes (Signed)
 Pt to ED at 4wk4d (confirmed by urine test at health dept) with concerns of SAB. Having bleeding and cramping since this AM. No clots or tissue. LMP was 11/13.

## 2024-11-19 ENCOUNTER — Ambulatory Visit: Payer: Self-pay | Admitting: Family Medicine

## 2024-11-19 ENCOUNTER — Ambulatory Visit (LOCAL_COMMUNITY_HEALTH_CENTER): Payer: Self-pay

## 2024-11-19 VITALS — BP 105/59 | Ht 67.5 in | Wt 190.0 lb

## 2024-11-19 DIAGNOSIS — Z3201 Encounter for pregnancy test, result positive: Secondary | ICD-10-CM

## 2024-11-19 DIAGNOSIS — Z3009 Encounter for other general counseling and advice on contraception: Secondary | ICD-10-CM

## 2024-11-19 LAB — PREGNANCY, URINE: Preg Test, Ur: POSITIVE — AB

## 2024-11-19 MED ORDER — PRENATAL 27-0.8 MG PO TABS: 1.0000 | ORAL_TABLET | Freq: Every day | ORAL | Status: AC

## 2024-11-19 NOTE — Progress Notes (Signed)
 Pt reports 09/17/24 became pregnant miscarried bleeding occurred on 10/17/24 had ultrasound no POC were seen, f/u one week later and cleared Currently LMP was 10/17/24 not using birth control at this time. + Urine Pregnancy test.Pt said she was happy with the results. Reviewed packet in detail with pt, answered questions, encouraged to obtain her Medicaid and WIC today while she is here.PNV dispensed provided counseling.  Rico DELENA Aden, RN

## 2024-12-06 ENCOUNTER — Emergency Department
Admission: EM | Admit: 2024-12-06 | Discharge: 2024-12-06 | Disposition: A | Discharge status: Home / Self Care | Payer: Self-pay | Attending: Emergency Medicine | Admitting: Emergency Medicine

## 2024-12-06 ENCOUNTER — Encounter: Payer: Self-pay | Admitting: Emergency Medicine

## 2024-12-06 ENCOUNTER — Other Ambulatory Visit: Payer: Self-pay

## 2024-12-06 DIAGNOSIS — J45909 Unspecified asthma, uncomplicated: Secondary | ICD-10-CM | POA: Insufficient documentation

## 2024-12-06 DIAGNOSIS — O99511 Diseases of the respiratory system complicating pregnancy, first trimester: Secondary | ICD-10-CM | POA: Insufficient documentation

## 2024-12-06 DIAGNOSIS — O2341 Unspecified infection of urinary tract in pregnancy, first trimester: Secondary | ICD-10-CM | POA: Insufficient documentation

## 2024-12-06 DIAGNOSIS — Z3A01 Less than 8 weeks gestation of pregnancy: Secondary | ICD-10-CM | POA: Insufficient documentation

## 2024-12-06 DIAGNOSIS — O219 Vomiting of pregnancy, unspecified: Secondary | ICD-10-CM | POA: Insufficient documentation

## 2024-12-06 LAB — COMPREHENSIVE METABOLIC PANEL WITH GFR
ALT: 8 U/L (ref 0–44)
AST: 13 U/L — ABNORMAL LOW (ref 15–41)
Albumin: 4.3 g/dL (ref 3.5–5.0)
Alkaline Phosphatase: 71 U/L (ref 38–126)
Anion gap: 12 (ref 5–15)
BUN: 9 mg/dL (ref 6–20)
CO2: 23 mmol/L (ref 22–32)
Calcium: 9.7 mg/dL (ref 8.9–10.3)
Chloride: 102 mmol/L (ref 98–111)
Creatinine, Ser: 0.69 mg/dL (ref 0.44–1.00)
GFR, Estimated: >60 mL/min
Glucose, Bld: 106 mg/dL — ABNORMAL HIGH (ref 70–99)
Potassium: 3.9 mmol/L (ref 3.5–5.1)
Sodium: 137 mmol/L (ref 135–145)
Total Bilirubin: 0.5 mg/dL (ref 0.0–1.2)
Total Protein: 7.4 g/dL (ref 6.5–8.1)

## 2024-12-06 LAB — CBC
HCT: 41.2 % (ref 36.0–46.0)
Hemoglobin: 14.2 g/dL (ref 12.0–15.0)
MCH: 30.7 pg (ref 26.0–34.0)
MCHC: 34.5 g/dL (ref 30.0–36.0)
MCV: 89 fL (ref 80.0–100.0)
Platelets: 246 10*3/uL (ref 150–400)
RBC: 4.63 MIL/uL (ref 3.87–5.11)
RDW: 13 % (ref 11.5–15.5)
WBC: 6.8 10*3/uL (ref 4.0–10.5)
nRBC: 0 % (ref 0.0–0.2)

## 2024-12-06 LAB — LIPASE, BLOOD: Lipase: 13 U/L (ref 11–51)

## 2024-12-06 LAB — URINALYSIS, ROUTINE W REFLEX MICROSCOPIC
Bilirubin Urine: NEGATIVE
Glucose, UA: NEGATIVE mg/dL
Hgb urine dipstick: NEGATIVE
Ketones, ur: 5 mg/dL — AB
Nitrite: NEGATIVE
Protein, ur: NEGATIVE mg/dL
Specific Gravity, Urine: 1.026 (ref 1.005–1.030)
pH: 5 (ref 5.0–8.0)

## 2024-12-06 LAB — PREGNANCY, URINE: Preg Test, Ur: POSITIVE — AB

## 2024-12-06 MED ORDER — VITAMIN B-6 25 MG PO TABS: 25.0000 mg | ORAL_TABLET | Freq: Every day | ORAL | 0 refills | Status: AC

## 2024-12-06 MED ORDER — PYRIDOXINE HCL 25 MG PO TABS
25.0000 mg | ORAL_TABLET | Freq: Every day | ORAL | Status: DC
  Administered 2024-12-06: 25 mg via ORAL
  Filled 2024-12-06: qty 1

## 2024-12-06 MED ORDER — CEPHALEXIN 500 MG PO CAPS: 500.0000 mg | ORAL_CAPSULE | Freq: Four times a day (QID) | ORAL | 0 refills | Status: AC

## 2024-12-06 MED ORDER — SODIUM CHLORIDE 0.9 % IV BOLUS
1000.0000 mL | Freq: Once | INTRAVENOUS | Status: AC
  Administered 2024-12-06: 1000 mL via INTRAVENOUS

## 2024-12-06 NOTE — ED Notes (Signed)
 See triage note  Presents with some n/v   States she is approx [redacted] weeks pregnant  Denies any vaginal bleeding

## 2024-12-08 LAB — URINE CULTURE: Culture: >=100000 — AB
